# Patient Record
Sex: Female | Born: 1985 | Race: White | Hispanic: No | Marital: Single | Smoking: Never smoker
Health system: Southern US, Community
[De-identification: ages and names within clinical notes are randomized; demographics above are authoritative.]

## PROBLEM LIST (undated history)

## (undated) DIAGNOSIS — J45909 Unspecified asthma, uncomplicated: Secondary | ICD-10-CM

## (undated) DIAGNOSIS — T7840XA Allergy, unspecified, initial encounter: Secondary | ICD-10-CM

## (undated) DIAGNOSIS — F419 Anxiety disorder, unspecified: Secondary | ICD-10-CM

## (undated) DIAGNOSIS — D649 Anemia, unspecified: Secondary | ICD-10-CM

## (undated) HISTORY — DX: Allergy, unspecified, initial encounter: T78.40XA

## (undated) HISTORY — DX: Anemia, unspecified: D64.9

## (undated) HISTORY — DX: Unspecified asthma, uncomplicated: J45.909

## (undated) HISTORY — PX: WISDOM TOOTH EXTRACTION: SHX21

## (undated) HISTORY — DX: Anxiety disorder, unspecified: F41.9

---

## 2010-04-01 ENCOUNTER — Ambulatory Visit: Payer: Self-pay | Admitting: Family Medicine

## 2010-04-01 DIAGNOSIS — M25579 Pain in unspecified ankle and joints of unspecified foot: Secondary | ICD-10-CM | POA: Insufficient documentation

## 2010-04-15 ENCOUNTER — Ambulatory Visit: Payer: Self-pay | Admitting: Family Medicine

## 2010-04-18 ENCOUNTER — Encounter: Admission: RE | Admit: 2010-04-18 | Discharge: 2010-04-18 | Payer: Self-pay | Admitting: Family Medicine

## 2010-04-18 ENCOUNTER — Encounter: Payer: Self-pay | Admitting: Family Medicine

## 2010-04-19 ENCOUNTER — Encounter: Payer: Self-pay | Admitting: Family Medicine

## 2010-11-19 NOTE — Letter (Signed)
Summary: [facility]   Imported By: Marily Memos 04/30/2010 11:56:30  _____________________________________________________________________  External Attachment:    Type:   Image     Comment:   External Document

## 2010-11-19 NOTE — Letter (Signed)
Summary: Imaging Authorization [facility]  Imaging Authorization [employer]   Imported By: Judge Stall 04/19/2010 09:56:35  _____________________________________________________________________  External Attachment:    Type:   Image     Comment:   External Document

## 2010-11-19 NOTE — Letter (Signed)
Summary: [employer], MRI request form  [employer], MRI request form   Imported By: Marily Memos 04/16/2010 10:31:48  _____________________________________________________________________  External Attachment:    Type:   Image     Comment:   External Document

## 2010-11-19 NOTE — Assessment & Plan Note (Signed)
Summary: F/U,MC   Vital Signs:  Patient profile:   25 year old female BP sitting:   115 / 57  Vitals Entered By: Lillia Pauls CMA (April 15, 2010 4:08 PM)  History of Present Illness: f/u ankle  pain no improvement--pain still 3/10 when she does aggravating motions likely plantr flexion and eversion /inversion no new symptoms has  done her exercises regularly.  Review of Systems       Please see HPI for additional ROS.   Physical Exam  Msk:  Left ankle TTP along extensor hallucis lingius. questionable tenderness over navicular nbone  normal sensation to soft touch gaut is normal   Impression & Recommendations:  Problem # 1:  ANKLE PAIN, LEFT (ICD-719.47)  Orders: MRI without Contrast (MRI w/o Contrast) not improved will image  Complete Medication List: 1)  Voltaren 1 % Gel (Diclofenac sodium) .... Sig as directed to ankle one tube (100 g)  Patient Instructions: 1)  MRI OF L ANKLE SCHD FOR WED JUNE 29 AT 7:45 PM AT THE [facility].

## 2010-11-19 NOTE — Assessment & Plan Note (Signed)
Summary: NP,ANKLE INJURY,MC   Vital Signs:  Patient profile:   25 year old female Height:      69 inches Weight:      123 pounds BMI:     18.23 BP sitting:   109 / 72  Vitals Entered By: Lillia Pauls CMA (April 01, 2010 4:28 PM)  History of Present Illness: DATE of INJURY:  February 13, 2010  Playing soccer, stuck left foot out to stop ball coming across from her medial side. Immediate 3/10 pain. Not a lot of swelling. Has essentially not gotten better. pain with plantar flexion and with soccer---has been able to play tennis. Ankle feels stiff. has tried brace and NSAIDS-no help, PERTINENT PMH/PSH:  No prior injury other than mild sprain to this ankle. no surgery to ankle-  Review of Systems  The patient denies anorexia, fever, weight loss, and weight gain.         see hpi  Physical Exam  General:  alert, well-developed, well-nourished, and well-hydrated.   Additional Exam:  [place] limited--outline navicular bone shows no cortical defect. Some edema seen inteh tendon sheath for ant tib   Foot/Ankle Exam  Gait:    Normal heel-toe gait pattern bilaterally.    Skin:    Intact with no scars, lesions, rashes, or bruising.    Palpation:    TTP left ankle over tibialis anterior tendon sheath. Some mild tenderness surrounding this on navicular bone but no discrete area of tenderness  Ankle Exam:    Right:    Inspection/Palpation:  Full strength dorsi and plantar flexion. Pain with reisted  plantar flexion in area ant tib tnedon.  Ankle stable to anterior drawer, eversion and inversion   Impression & Recommendations:  Problem # 1:  ANKLE PAIN, LEFT (ICD-719.47)  Orders: Theraband per yard (A9300) anterio tibialis tendonitis stretches, ice heat rtc 3 weeks. There could be an underlying issue with navicular but I suspect this is isoltaed tendonitis. if not resolving with tx, wil consider further imaging navicular  Complete Medication List: 1)  Voltaren 1 % Gel (Diclofenac  sodium) .... Sig as directed to ankle one tube (100 g)  Patient Instructions: 1)  Tendonitis in anterior foot/ankle; 2)  Use heat for 10 minutes followed by ice 15 min twice a day 3)  Use the voltaren gel 4 times a day to that area 4)  Do the exercises three times a day 5)  See me in 2 weeks or so. Prescriptions: VOLTAREN 1 % GEL (DICLOFENAC SODIUM) sig as directed to ankle one tube (100 g)  #1 x 1   Entered and Authorized by:   Denny Levy MD   Signed by:   Denny Levy MD on 04/01/2010   Method used:   Electronically to        [facility]. [phone]* (retail)       [address]       [place], [place]  [address]       Ph: [phone]       Fax: [phone]   RxID:   [id number]

## 2010-12-27 ENCOUNTER — Encounter: Payer: Self-pay | Admitting: *Deleted

## 2011-12-15 ENCOUNTER — Other Ambulatory Visit: Payer: Self-pay | Admitting: Internal Medicine

## 2011-12-15 DIAGNOSIS — F419 Anxiety disorder, unspecified: Secondary | ICD-10-CM

## 2011-12-15 DIAGNOSIS — F429 Obsessive-compulsive disorder, unspecified: Secondary | ICD-10-CM

## 2011-12-15 MED ORDER — CLOMIPRAMINE HCL 50 MG PO CAPS: 50.0000 mg | ORAL_CAPSULE | Freq: Every day | ORAL | Status: DC

## 2011-12-15 NOTE — Assessment & Plan Note (Signed)
decr to 40 proz and add anaf 40 at hs  Cont prn xanax F/u next week here

## 2012-02-06 ENCOUNTER — Other Ambulatory Visit: Payer: Self-pay | Admitting: Internal Medicine

## 2012-02-06 DIAGNOSIS — F429 Obsessive-compulsive disorder, unspecified: Secondary | ICD-10-CM

## 2012-02-06 MED ORDER — ESCITALOPRAM OXALATE 20 MG PO TABS: 20.0000 mg | ORAL_TABLET | Freq: Every day | ORAL | Status: DC

## 2012-02-06 NOTE — Progress Notes (Signed)
prozac 40.Marland Kitchen.fair control but not enough to keep from affecting academics and anxiety level. At 60mg =terrible dreams/sweating Doing well with therapy Will change to lexapro 20mg  and advance slowly

## 2012-04-17 ENCOUNTER — Other Ambulatory Visit: Payer: Self-pay | Admitting: Internal Medicine

## 2012-04-17 MED ORDER — FLUOXETINE HCL 20 MG PO TABS: 60.0000 mg | ORAL_TABLET | Freq: Every day | ORAL | Status: DC

## 2012-04-17 NOTE — Progress Notes (Signed)
Lexapro 20 mg hasn't been as effective as Prozac Dreams continued to be a big part of the problem along with her daytime obsessions mostly involving her recent relationship Will change back to Prozac at a dose of 60 mg She has an appointment with psychiatry in [place] for August  referred her to [facility] for therapy She will followup by phone or e-mail or office visit

## 2012-06-11 ENCOUNTER — Other Ambulatory Visit: Payer: Self-pay | Admitting: Internal Medicine

## 2012-06-11 NOTE — Telephone Encounter (Signed)
Patient's chart is at the nurses station in the pa pool pile. °

## 2012-06-11 NOTE — Telephone Encounter (Signed)
LMOM for patient to call back.

## 2012-06-11 NOTE — Telephone Encounter (Signed)
Please call this patient.  Last seen here 09/13/2011. I don't see that we've ever done her GYN exam, and that she has a GYN.  Please get the details.  I am happy to authorize one month, but she needs to be seen either here or at GYN for additional refills.

## 2012-06-14 ENCOUNTER — Telehealth: Payer: Self-pay

## 2012-06-14 NOTE — Telephone Encounter (Signed)
Chart pulled DOS 09/13/11 at the nurses station in the PA pool pile.

## 2012-06-14 NOTE — Telephone Encounter (Signed)
PT STATES SOMEONE HAD CALLED HER REGARDING HER MEDS. PLEASE CALL [phone]

## 2012-06-14 NOTE — Telephone Encounter (Signed)
Pt stated that her pharmacy had mistakenly sent the request for her Nuva-ring RF here instead of to her GYN but she has already gotten in touch w/her GYN and they have done the RF. No action needed on our part.

## 2012-06-25 ENCOUNTER — Other Ambulatory Visit: Payer: Self-pay | Admitting: Family Medicine

## 2012-06-25 MED ORDER — ALPRAZOLAM 0.25 MG PO TABS: 0.2500 mg | ORAL_TABLET | Freq: Three times a day (TID) | ORAL | Status: AC | PRN

## 2012-06-25 MED ORDER — FLUOXETINE HCL 20 MG PO TABS: 20.0000 mg | ORAL_TABLET | Freq: Every day | ORAL | Status: DC

## 2012-08-03 ENCOUNTER — Other Ambulatory Visit: Payer: Self-pay | Admitting: Internal Medicine

## 2012-08-03 DIAGNOSIS — F429 Obsessive-compulsive disorder, unspecified: Secondary | ICD-10-CM

## 2012-08-03 MED ORDER — FLUOXETINE HCL 20 MG PO TABS: 60.0000 mg | ORAL_TABLET | Freq: Every day | ORAL | Status: DC

## 2012-08-03 NOTE — Progress Notes (Signed)
In June I referred her to [facility] for Psychiatry consult-she is doing well Biweekly visits with him doing therapy. He did not change her meds and it is most convenient for her if I keep prescribing Meds ordered this encounter  Medications  . FLUoxetine (PROZAC) 20 MG tablet    Sig: Take 3 tablets (60 mg total) by mouth daily.    Dispense:  270 tablet    Refill:  3

## 2013-01-23 ENCOUNTER — Ambulatory Visit (INDEPENDENT_AMBULATORY_CARE_PROVIDER_SITE_OTHER): Payer: BC Managed Care – PPO | Admitting: Internal Medicine

## 2013-01-23 VITALS — BP 98/62 | HR 64 | Temp 98.1°F | Resp 16 | Ht 69.2 in | Wt 116.8 lb

## 2013-01-23 DIAGNOSIS — R5383 Other fatigue: Secondary | ICD-10-CM

## 2013-01-23 DIAGNOSIS — Z0000 Encounter for general adult medical examination without abnormal findings: Secondary | ICD-10-CM

## 2013-01-23 DIAGNOSIS — D509 Iron deficiency anemia, unspecified: Secondary | ICD-10-CM

## 2013-01-23 DIAGNOSIS — R5381 Other malaise: Secondary | ICD-10-CM

## 2013-01-23 LAB — COMPREHENSIVE METABOLIC PANEL
Albumin: 4.3 g/dL (ref 3.5–5.2)
BUN: 10 mg/dL (ref 6–23)
Calcium: 9.4 mg/dL (ref 8.4–10.5)
Potassium: 4.1 mEq/L (ref 3.5–5.3)
Sodium: 140 mEq/L (ref 135–145)

## 2013-01-23 LAB — POCT CBC
Granulocyte percent: 52.1 %G (ref 37–80)
HCT, POC: 42.8 % (ref 37.7–47.9)
Hemoglobin: 13.6 g/dL (ref 12.2–16.2)
Lymph, poc: 1.9 (ref 0.6–3.4)
MCV: 90.2 fL (ref 80–97)
POC Granulocyte: 2.4 (ref 2–6.9)
POC MID %: 7.1 %M (ref 0–12)
Platelet Count, POC: 295 10*3/uL (ref 142–424)
RBC: 4.75 M/uL (ref 4.04–5.48)
RDW, POC: 15 %

## 2013-01-23 LAB — T4, FREE: Free T4: 1.25 ng/dL (ref 0.80–1.80)

## 2013-01-23 LAB — IRON AND TIBC
%SAT: 19 % — ABNORMAL LOW (ref 20–55)
Iron: 85 ug/dL (ref 42–145)
TIBC: 451 ug/dL (ref 250–470)

## 2013-01-23 LAB — TSH: TSH: 1.783 u[IU]/mL (ref 0.350–4.500)

## 2013-01-23 NOTE — Progress Notes (Signed)
Subjective:    Patient ID: Kristen Lane, female    DOB: 12-Dec-1985, 27 y.o.   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  HENT: Positive for dental problem. Negative for hearing loss, rhinorrhea and neck pain.        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  Respiratory: Negative for cough, shortness of breath and wheezing.        Hx EIA but none recent  Cardiovascular: Negative for chest pain, palpitations and leg swelling.  Gastrointestinal: Negative for abdominal pain, diarrhea and constipation.  Genitourinary: Negative for dysuria, vaginal discharge, difficulty urinating, menstrual problem and pelvic pain.       Has gyn/nuva ring  Allergic/Immunologic: Negative for immunocompromised state.  Neurological: Negative for dizziness, weakness and headaches.  Hematological: Negative for adenopathy. Does not bruise/bleed easily.       Objective:   Physical Exam BP 98/62  Pulse 64  Temp(Src) 98.1 F (36.7 C) (Oral)  Resp 16  Ht 5' 9.2" (1.758 m)  Wt 116 lb 12.8 oz (52.98 kg)  BMI 17.14 kg/m2  SpO2 99%  LMP 01/18/2013 NAD/happy RERRLA/EOM conj ENT clear x tender over TMJs  No Tmeg/nodule Heart regular without murmur click Lungs clear Abdomen supple without organomegaly or masses Extremities  clear Neurological intact Psychiatric stable       Results for orders placed in visit on 01/23/13  POCT CBC      Result Value Range   WBC 4.6  4.6 - 10.2 K/uL   Lymph, poc 1.9  0.6 - 3.4   POC LYMPH PERCENT 40.8  10 - 50 %L   MID (cbc) 0.3  0 - 0.9   POC MID % 7.1  0 - 12 %M   POC Granulocyte 2.4  2 - 6.9   Granulocyte percent 52.1  37 - 80 %G   RBC 4.75  4.04 - 5.48 M/uL   Hemoglobin 13.6  12.2 - 16.2 g/dL   HCT, POC 40.9  81.1 - 47.9 %   MCV 90.2  80 - 97 fL   MCH, POC 28.6  27 - 31.2 pg   MCHC 31.8  31.8 - 35.4 g/dL   RDW, POC 91.4     Platelet Count, POC 295  142 - 424 K/uL   MPV 7.8  0 - 99.8 fL   EKG WNL incl QTc  Assessment & Plan:  CPE Hx Fe def anemia TMJ-clenching Fatigue intermittent OCD 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 FLUoxetine (PROZAC) 40 MG capsule    Sig: Take 80 mg by mouth daily.  . folic acid (FOLVITE) 1 MG tablet    Sig: Take 1 mg by mouth daily.  . Multiple Vitamins-Calcium (ONE-A-DAY WOMENS FORMULA PO)    Sig:  Take 1 tablet by mouth daily.  Marland Kitchen ALPRAZolam (XANAX) 0.25 MG tablet    Sig: Take 0.25 mg by mouth as needed for sleep.  . clonazePAM (KLONOPIN) 0.5 MG tablet    Sig: Take 0.5 mg by mouth as needed for anxiety.   Results for orders placed in visit on 01/23/13  COMPREHENSIVE METABOLIC PANEL      Result Value Range   Sodium 140  135 - 145 mEq/L   Potassium 4.1  3.5 - 5.3 mEq/L   Chloride 104  96 - 112 mEq/L   CO2 29  19 - 32 mEq/L   Glucose, Bld 87  70 - 99 mg/dL   BUN 10  6 - 23 mg/dL   Creat 1.91  4.78 - 2.95 mg/dL   Total Bilirubin 0.5  0.3 - 1.2 mg/dL   Alkaline Phosphatase 43  39 - 117 U/L   AST 20  0 - 37 U/L   ALT 14  0 - 35 U/L   Total Protein 7.3  6.0 - 8.3 g/dL   Albumin 4.3  3.5 - 5.2 g/dL   Calcium 9.4  8.4 - 62.1 mg/dL  TSH      Result Value Range   TSH 1.783  0.350 - 4.500 uIU/mL  T4, FREE      Result Value Range   Free T4 1.25  0.80 - 1.80 ng/dL  IRON AND TIBC      Result Value Range    Iron 85  42 - 145 ug/dL   UIBC 308  657 - 846 ug/dL   TIBC 962  952 - 841 ug/dL   %SAT 19 (*) 20 - 55 %  POCT CBC      Result Value Range   WBC 4.6  4.6 - 10.2 K/uL   Lymph, poc 1.9  0.6 - 3.4   POC LYMPH PERCENT 40.8  10 - 50 %L   MID (cbc) 0.3  0 - 0.9   POC MID % 7.1  0 - 12 %M   POC Granulocyte 2.4  2 - 6.9   Granulocyte percent 52.1  37 - 80 %G   RBC 4.75  4.04 - 5.48 M/uL   Hemoglobin 13.6  12.2 - 16.2 g/dL   HCT, POC 32.4  40.1 - 47.9 %   MCV 90.2  80 - 97 fL   MCH, POC 28.6  27 - 31.2 pg   MCHC 31.8  31.8 - 35.4 g/dL   RDW, POC 02.7     Platelet Count, POC 295  142 - 424 K/uL   MPV 7.8  0 - 99.8 fL

## 2013-01-24 ENCOUNTER — Encounter: Payer: Self-pay | Admitting: Internal Medicine

## 2013-07-17 ENCOUNTER — Ambulatory Visit (INDEPENDENT_AMBULATORY_CARE_PROVIDER_SITE_OTHER): Payer: BC Managed Care – PPO | Admitting: Internal Medicine

## 2013-07-17 VITALS — BP 102/62 | HR 60 | Temp 98.0°F | Resp 16 | Ht 68.5 in | Wt 123.2 lb

## 2013-07-17 DIAGNOSIS — R5381 Other malaise: Secondary | ICD-10-CM

## 2013-07-17 LAB — POCT CBC
Lymph, poc: 2.2 (ref 0.6–3.4)
MCHC: 32.1 g/dL (ref 31.8–35.4)
MCV: 93.5 fL (ref 80–97)
Platelet Count, POC: 245 10*3/uL (ref 142–424)

## 2013-07-17 NOTE — Progress Notes (Signed)
  Subjective:    Patient ID: Kristen Lane, female    DOB: [birth year], 27 y.o.   MRN: [id number]  HPI complaining of 4 weeks of fatigue with intermittent mild sore throat Had sore throat at the onset for a few days but no fever No cough or runny nose No other signs of infection No problems with sleep sHe is working hard in new job but this feeling persisted even during vacation last week Appetite is good  No new medications, in fact is decreasing dose of Prozac and now at 40 mg every other day under the guidance of her psychiatrist in [facility] Review of Systems Noncontributory with regard of present illness    Objective:   Physical Exam BP 102/62  Pulse 60  Temp(Src) 98 F (36.7 C) (Oral)  Resp 16  Ht 5' 8.5" (1.74 m)  Wt 123 lb 3.2 oz (55.883 kg)  BMI 18.46 kg/m2  SpO2 99%  LMP 07/13/2013 HEENT clear with no nodes No thyromegaly Heart regular without murmur Lungs clear Abdomen supple without organomegaly Skin without rash Joints clear Neurological intact Mood good/affect appropriate   Results for orders placed in visit on 07/17/13  POCT CBC      Result Value Range   WBC 4.6  4.6 - 10.2 K/uL   Lymph, poc 2.2  0.6 - 3.4   POC LYMPH PERCENT 48.5  10 - 50 %L   MID (cbc) 0.3  0 - 0.9   POC MID % 7.4  0 - 12 %M   POC Granulocyte 2.0  2 - 6.9   Granulocyte percent 44.1  37 - 80 %G   RBC 4.50  4.04 - 5.48 M/uL   Hemoglobin 13.5  12.2 - 16.2 g/dL   HCT, POC 40.9  81.1 - 47.9 %   MCV 93.5  80 - 97 fL   MCH, POC 30.0  27 - 31.2 pg   MCHC 32.1  31.8 - 35.4 g/dL   RDW, POC 91.4     Platelet Count, POC 245  142 - 424 K/uL   MPV 7.9  0 - 99.8 fL       Assessment & Plan:  Fatigue Lymphocytosis-ck ebv  eat sleep exercise and avoid stress Recheck if not well 1 month

## 2013-07-19 LAB — EPSTEIN-BARR VIRUS VCA ANTIBODY PANEL
EBV EA IgG: 24.1 U/mL — ABNORMAL HIGH (ref <9.0)
EBV NA IgG: <3 U/mL (ref <18.0)
EBV VCA IgG: 571 U/mL — ABNORMAL HIGH (ref <18.0)
EBV VCA IgM: <10 U/mL (ref <36.0)

## 2013-07-22 ENCOUNTER — Encounter: Payer: Self-pay | Admitting: Internal Medicine

## 2014-04-16 ENCOUNTER — Ambulatory Visit (INDEPENDENT_AMBULATORY_CARE_PROVIDER_SITE_OTHER): Payer: No Typology Code available for payment source | Admitting: Internal Medicine

## 2014-04-16 VITALS — BP 102/60 | HR 72 | Temp 98.2°F | Resp 18 | Wt 123.0 lb

## 2014-04-16 DIAGNOSIS — K120 Recurrent oral aphthae: Secondary | ICD-10-CM

## 2014-04-16 DIAGNOSIS — F429 Obsessive-compulsive disorder, unspecified: Secondary | ICD-10-CM

## 2014-04-16 MED ORDER — FLUOCINONIDE 0.05 % EX GEL: 1.0000 "application " | Freq: Two times a day (BID) | CUTANEOUS | Status: DC

## 2014-04-16 MED ORDER — AMOXICILLIN 875 MG PO TABS: 875.0000 mg | ORAL_TABLET | Freq: Two times a day (BID) | ORAL | Status: DC

## 2014-04-16 MED ORDER — LIDOCAINE VISCOUS 2 % MT SOLN: OROMUCOSAL | Status: DC

## 2014-04-16 NOTE — Progress Notes (Signed)
   Subjective:    Patient ID: Kristen Lane, female    DOB: [birth year], 28 y.o.   MRN: [id number]  HPI She has recurrent aphthous ulcers After no ulcer in several months she now has a right lower ulcer adjacent to her lower molar but has had increasing tenderness over the last 2-3 days with swelling noted on the external jawline/she worries about the development of an abscess which has happened once before  MSW counselor working with Dr Pablo Ledger Past medical problems doing well-recently started Effexor which is having good effect  Review of Systems No fever    Objective:   Physical Exam BP 102/60  Pulse 72  Temp(Src) 98.2 F (36.8 C)  Resp 18  Wt 123 lb (55.792 kg)  SpO2 100%  LMP 04/10/2014 There is an 0.7 cm tender ulcer adjacent to the right second lower molar/no root abscess She is tender externally with swelling along the mandible but no erythema No regional adenopathy      Assessment & Plan:  Oral aphthous ulcer  OCD (obsessive compulsive disorder)  Meds ordered this encounter  Medications  . lidocaine (XYLOCAINE) 2 % solution    Sig: Apply to lesion every 2 h as needed for pain    Dispense:  100 mL    Refill:  1  . amoxicillin (AMOXIL) 875 MG tablet    Sig: Take 1 tablet (875 mg total) by mouth 2 (two) times daily.    Dispense:  20 tablet    Refill:  0  . fluocinonide gel (LIDEX) 0.05 %    Sig: Apply 1 application topically 2 (two) times daily.    Dispense:  30 g    Refill:  0

## 2014-08-10 ENCOUNTER — Other Ambulatory Visit: Payer: Self-pay | Admitting: Internal Medicine

## 2014-08-10 DIAGNOSIS — F429 Obsessive-compulsive disorder, unspecified: Secondary | ICD-10-CM

## 2014-08-10 DIAGNOSIS — J45990 Exercise induced bronchospasm: Secondary | ICD-10-CM

## 2014-08-10 MED ORDER — ALBUTEROL SULFATE HFA 108 (90 BASE) MCG/ACT IN AERS: 2.0000 | INHALATION_SPRAY | Freq: Four times a day (QID) | RESPIRATORY_TRACT | Status: DC | PRN

## 2014-08-10 MED ORDER — VENLAFAXINE HCL ER 75 MG PO CP24: 225.0000 mg | ORAL_CAPSULE | Freq: Every day | ORAL | Status: DC

## 2014-08-10 NOTE — Progress Notes (Signed)
OCD (obsessive compulsive disorder)  Dr Fabienne Bruns and Kristen Lane well on effexor 225 xr tho trouble w/ insurance coverage for brand name(800$) Needs klon rarely Also needs inhaler for EIA  Meds ordered this encounter  Medications  . venlafaxine XR (EFFEXOR-XR) 75 MG 24 hr capsule    Sig: Take 3 capsules (225 mg total) by mouth daily with breakfast.    Dispense:  90 capsule    Refill:  5  . albuterol (PROVENTIL HFA;VENTOLIN HFA) 108 (90 BASE) MCG/ACT inhaler    Sig: Inhale 2 puffs into the lungs every 6 (six) hours as needed for wheezing or shortness of breath.    Dispense:  1 Inhaler    Refill:  3

## 2014-12-24 ENCOUNTER — Ambulatory Visit (INDEPENDENT_AMBULATORY_CARE_PROVIDER_SITE_OTHER): Payer: No Typology Code available for payment source | Admitting: Family Medicine

## 2014-12-24 VITALS — BP 110/70 | HR 76 | Temp 98.4°F | Ht 69.0 in | Wt 120.1 lb

## 2014-12-24 DIAGNOSIS — L209 Atopic dermatitis, unspecified: Secondary | ICD-10-CM | POA: Diagnosis not present

## 2014-12-24 MED ORDER — TRIAMCINOLONE 0.1 % CREAM:EUCERIN CREAM 1:1: 1.0000 "application " | TOPICAL_CREAM | Freq: Three times a day (TID) | CUTANEOUS | Status: AC

## 2014-12-24 MED ORDER — PREDNISONE 20 MG PO TABS: 20.0000 mg | ORAL_TABLET | Freq: Two times a day (BID) | ORAL | Status: DC

## 2014-12-24 NOTE — Patient Instructions (Signed)
Eczema Eczema, also called atopic dermatitis, is a skin disorder that causes inflammation of the skin. It causes a red rash and dry, scaly skin. The skin becomes very itchy. Eczema is generally worse during the cooler winter months and often improves with the warmth of summer. Eczema usually starts showing signs in infancy. Some children outgrow eczema, but it may last through adulthood.  CAUSES  The exact cause of eczema is not known, but it appears to run in families. People with eczema often have a family history of eczema, allergies, asthma, or hay fever. Eczema is not contagious. Flare-ups of the condition may be caused by:   Contact with something you are sensitive or allergic to.   Stress. SIGNS AND SYMPTOMS  Dry, scaly skin.   Red, itchy rash.   Itchiness. This may occur before the skin rash and may be very intense.  DIAGNOSIS  The diagnosis of eczema is usually made based on symptoms and medical history. TREATMENT  Eczema cannot be cured, but symptoms usually can be controlled with treatment and other strategies. A treatment plan might include:  Controlling the itching and scratching.   Use over-the-counter antihistamines as directed for itching. This is especially useful at night when the itching tends to be worse.   Use over-the-counter steroid creams as directed for itching.   Avoid scratching. Scratching makes the rash and itching worse. It may also result in a skin infection (impetigo) due to a break in the skin caused by scratching.   Keeping the skin well moisturized with creams every day. This will seal in moisture and help prevent dryness. Lotions that contain alcohol and water should be avoided because they can dry the skin.   Limiting exposure to things that you are sensitive or allergic to (allergens).   Recognizing situations that cause stress.   Developing a plan to manage stress.  HOME CARE INSTRUCTIONS   Only take over-the-counter or  prescription medicines as directed by your health care provider.   Do not use anything on the skin without checking with your health care provider.   Keep baths or showers short (5 minutes) in warm (not hot) water. Use mild cleansers for bathing. These should be unscented. You may add nonperfumed bath oil to the bath water. It is best to avoid soap and bubble bath.   Immediately after a bath or shower, when the skin is still damp, apply a moisturizing ointment to the entire body. This ointment should be a petroleum ointment. This will seal in moisture and help prevent dryness. The thicker the ointment, the better. These should be unscented.   Keep fingernails cut short. Children with eczema may need to wear soft gloves or mittens at night after applying an ointment.   Dress in clothes made of cotton or cotton blends. Dress lightly, because heat increases itching.   A child with eczema should stay away from anyone with fever blisters or cold sores. The virus that causes fever blisters (herpes simplex) can cause a serious skin infection in children with eczema. SEEK MEDICAL CARE IF:   Your itching interferes with sleep.   Your rash gets worse or is not better within 1 week after starting treatment.   You see pus or soft yellow scabs in the rash area.   You have a fever.   You have a rash flare-up after contact with someone who has fever blisters.  Document Released: 10/03/2000 Document Revised: 07/27/2013 Document Reviewed: 05/09/2013 [employer] Patient Information 2015 [employer]. This information   is not intended to replace advice given to you by your health care provider. Make sure you discuss any questions you have with your health care provider.  

## 2014-12-24 NOTE — Progress Notes (Signed)
    MRN: [id number] DOB: Mar 14, 1986  Subjective:   Kristen Lane is a 29 y.o. female presenting for chief complaint of Rash  Reports 1 week history of rash over her right hand. Rash is severely itchy. Has tried taking benadryl and a steroid cream she had from a previous skin rash (07/2014) with minimal to no relief. She was treated for scabies 07/2014, provided with steroid cream for itching. Admits that this rash is different from her scabies rash. Denies fevers, drainage of pus or bleeding, spread of rash, burning, stinging, numbness, tingling. Denies any other aggravating or relieving factors, no other questions or concerns.  Kristen Lane has a current medication list which includes the following prescription(s): albuterol, clonazepam, multiple vitamins-calcium, nuvaring, venlafaxine xr, prednisone, and triamcinolone 0.1 % cream : eucerin. She has No Known Allergies.  Kristen Lane  has a past medical history of Allergy; Anemia; Anxiety; and Asthma. Also  has no past surgical history on file.  ROS As in subjective.  Objective:   Vitals: BP 110/70 mmHg  Pulse 76  Temp(Src) 98.4 F (36.9 C) (Oral)  Ht 5\' 9"  (1.753 m)  Wt 120 lb 2 oz (54.488 kg)  BMI 17.73 kg/m2  SpO2 99%  Physical Exam  Constitutional: She is oriented to person, place, and time and well-developed, well-nourished, and in no distress.  Cardiovascular: Normal rate.   Pulmonary/Chest: Effort normal.  Neurological: She is alert and oriented to person, place, and time.  Skin: Skin is warm and dry. Rash (dry scaly, maculopapular rash over dorsal aspect of right hand, mainly over metacarpal region up to wrist) noted. No erythema. No pallor.   Assessment and Plan :   1. Atopic dermatitis - Start oral steroid x3 doses of 40mg , use with triamcinolone-eucerin cream topically - Return to clinic if symptoms do not improve in 1 week  Jaynee Eagles, PA-C [facility] [phone] 12/24/2014  3:35 PM

## 2015-01-08 ENCOUNTER — Encounter: Payer: Self-pay | Admitting: Internal Medicine

## 2015-01-08 NOTE — Progress Notes (Unsigned)
   Subjective:    Patient ID: Kristen Lane, female    DOB: 03-03-1986, 29 y.o.   MRN: [id number]  HPI Needs follow-up appointment to discuss current medications   Review of Systems     Objective:   Physical Exam        Assessment & Plan:

## 2015-01-10 ENCOUNTER — Ambulatory Visit (INDEPENDENT_AMBULATORY_CARE_PROVIDER_SITE_OTHER): Payer: No Typology Code available for payment source | Admitting: Internal Medicine

## 2015-01-10 VITALS — BP 100/64 | HR 73 | Temp 97.6°F | Resp 18 | Ht 68.2 in | Wt 118.0 lb

## 2015-01-10 DIAGNOSIS — F429 Obsessive-compulsive disorder, unspecified: Secondary | ICD-10-CM

## 2015-01-10 DIAGNOSIS — F42 Obsessive-compulsive disorder: Secondary | ICD-10-CM | POA: Diagnosis not present

## 2015-01-12 NOTE — Progress Notes (Signed)
Follow-up for obsessive compulsive disorder  Has done extremely well over the last 2 years. 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 Has had some medication side effects and problems with insurance coverage over medications. She originally responded(Dr Kristen Lane) to Effexor brand name extended release(AFTER PROZAC) and then had to be switched to regular Effexor which caused her to have loss of control of obsessions as well as having palpitations. Her level of anxiety was increased as well. This happened to some degree when she was switched to the generic brand of the extended release even when increased to 225 mg she only increase the side effects and didn't increase the control of obsessive thinking. Over the last 2 months at 225 mg of extended release generic she has experienced frequent pounding heart rates that are random and without chest pain or shortness of breath. She may have the specifically after eating at other times as well. The pounding will last for about 30 minutes. A cup need by epigastric pain at times. No reflux, no nausea, no diarrhea. She has reduce his dose to 150 mg over the last 2 weeks and those symptoms have resolved but she is having more trouble controlling her obsessions. If possible she wants to resume the brand name treatment.  Now on a steady relationship that is very good. She still has nightmares and occasional daytime obsessive thinking about her former relationship. Refer to past chart notes for more explanation. These dreams are vivid and will awaken her. Fortunately her daytime symptoms are not enough to interfere with work or play and her not inhibiting joy. She has a history of vivid dreams and childhood both good and bad but no overt anxiety disorder. Her nightmares never fit with sleep care [place] or with nocturnal seizures with regard to the symptoms. Although, she does have vocalizations commonly. She doesn't wake with a  disorientation. She does not have restless legs or nocturnal movements. There is no amnesia in the morning. She may have multiple dreams at night. While sometimes these drains make her feel nonrestorative she has plenty of energy for athletics. She is always been someone who is quick to take naps and seemed to need more sleep than everyone else but she does not have sleepiness while driving wheezing are going to movies.  She has Klonopin to use for panic attacks but has not needed it except to sleep on occasion over the past 2 weeks.  From a health standpoint she has no symptoms. Weight has remained stable pop. She is very active with various sports/league play.  She is now in counseling therapy with Kristen Lane and feels this is been very beneficial as they are looking back at childhood problems that outline enmeshment-especially with Mom but pervasive in relationships in general. Worsening after puberty but without definite self-esteem issues. She had issues particularly with going away to college and leaving mom behind. Working on "understanding anatomy of personality schemas."  Impression OCD (obsessive compulsive disorder)  medication side effects  Overall she is much improved over 2-3 years ago. We will try to get preauthorization to resume the name brand Effexor XR 150 mg She will continue therapy She may call for medicine refills

## 2015-01-15 ENCOUNTER — Telehealth: Payer: Self-pay

## 2015-01-15 NOTE — Telephone Encounter (Signed)
PA completed for Brand name Effexor on covermymeds. Pending.

## 2015-01-18 MED ORDER — EFFEXOR XR 150 MG PO CP24: 150.0000 mg | ORAL_CAPSULE | Freq: Every day | ORAL | Status: DC

## 2015-01-18 NOTE — Telephone Encounter (Signed)
Pt called back and asked for the name brand Effexor Rx to be sent in. Dr Doolittle's notes state that he wants her taking the 150mg  ER if it is approved. I will send in the Rx, and pt will notify us if it is not effective.

## 2015-01-18 NOTE — Telephone Encounter (Signed)
Dr Laney Pastor, Juluis Rainier.

## 2015-01-18 NOTE — Telephone Encounter (Signed)
PA approved through 01/16/18. Notified pt on VM.

## 2015-01-18 NOTE — Addendum Note (Signed)
Addended by: Elwyn Reach A on: 01/18/2015 05:00 PM   Modules accepted: Orders

## 2015-01-19 ENCOUNTER — Telehealth: Payer: Self-pay

## 2015-01-19 NOTE — Telephone Encounter (Signed)
Patient is unable to get her medication because the pharmacy doesn't see that the insurance approved it. Please call! [phone]

## 2015-01-22 NOTE — Telephone Encounter (Signed)
Approved.  

## 2015-01-22 NOTE — Telephone Encounter (Signed)
I have the PA with me and will complete today.

## 2015-02-14 ENCOUNTER — Ambulatory Visit (INDEPENDENT_AMBULATORY_CARE_PROVIDER_SITE_OTHER): Payer: No Typology Code available for payment source | Admitting: Internal Medicine

## 2015-02-14 ENCOUNTER — Encounter: Payer: Self-pay | Admitting: Internal Medicine

## 2015-02-14 VITALS — BP 96/60 | HR 73 | Temp 98.1°F | Resp 16 | Ht 69.0 in | Wt 114.8 lb

## 2015-02-14 DIAGNOSIS — Z23 Encounter for immunization: Secondary | ICD-10-CM | POA: Diagnosis not present

## 2015-02-14 DIAGNOSIS — R1013 Epigastric pain: Secondary | ICD-10-CM

## 2015-02-14 LAB — COMPREHENSIVE METABOLIC PANEL
ALT: 17 U/L (ref 0–35)
AST: 19 U/L (ref 0–37)
Albumin: 4.2 g/dL (ref 3.5–5.2)
Alkaline Phosphatase: 42 U/L (ref 39–117)
BILIRUBIN TOTAL: 0.5 mg/dL (ref 0.2–1.2)
BUN: 8 mg/dL (ref 6–23)
CHLORIDE: 101 meq/L (ref 96–112)
CO2: 24 mEq/L (ref 19–32)
CREATININE: 0.85 mg/dL (ref 0.50–1.10)
Calcium: 9.4 mg/dL (ref 8.4–10.5)
Glucose, Bld: 87 mg/dL (ref 70–99)
POTASSIUM: 5.2 meq/L (ref 3.5–5.3)
Sodium: 140 mEq/L (ref 135–145)
Total Protein: 6.6 g/dL (ref 6.0–8.3)

## 2015-02-14 LAB — CBC WITH DIFFERENTIAL/PLATELET
Basophils Absolute: 0 10*3/uL (ref 0.0–0.1)
Basophils Relative: 0 % (ref 0–1)
EOS ABS: 1.3 10*3/uL — AB (ref 0.0–0.7)
EOS PCT: 19 % — AB (ref 0–5)
HCT: 37.8 % (ref 36.0–46.0)
HEMOGLOBIN: 13 g/dL (ref 12.0–15.0)
LYMPHS ABS: 2.6 10*3/uL (ref 0.7–4.0)
Lymphocytes Relative: 38 % (ref 12–46)
MCH: 29.4 pg (ref 26.0–34.0)
MCHC: 34.4 g/dL (ref 30.0–36.0)
MCV: 85.5 fL (ref 78.0–100.0)
MONO ABS: 0.4 10*3/uL (ref 0.1–1.0)
MONOS PCT: 6 % (ref 3–12)
MPV: 9 fL (ref 8.6–12.4)
Neutro Abs: 2.6 10*3/uL (ref 1.7–7.7)
Neutrophils Relative %: 37 % — ABNORMAL LOW (ref 43–77)
Platelets: 244 10*3/uL (ref 150–400)
RBC: 4.42 MIL/uL (ref 3.87–5.11)
RDW: 13.5 % (ref 11.5–15.5)
WBC: 6.9 10*3/uL (ref 4.0–10.5)

## 2015-02-14 MED ORDER — SUCRALFATE 1 GM/10ML PO SUSP: 1.0000 g | Freq: Two times a day (BID) | ORAL | Status: DC

## 2015-02-14 MED ORDER — OMEPRAZOLE 40 MG PO CPDR: 40.0000 mg | DELAYED_RELEASE_CAPSULE | Freq: Every day | ORAL | Status: DC

## 2015-02-15 NOTE — Progress Notes (Signed)
Subjective:    Patient ID: Kristen Lane, female    DOB: 1986-03-27, 29 y.o.   MRN: [id number]  HPI history of intermittent epigastric distress over the past 6 weeks She has a burning feeling with occasional pain that seems related to change in diet to foods that are high in fiber into smoothies that are very concentrated with fruits and protein boosters. This is increased in frequency and now makes her uncomfortable several hours a day. She has no nocturnal pain and no postprandial right upper quadrant cramping. There is some mild nausea but no vomiting. No diarrhea or constipation. No past history of reflux or ulcers. No history of irritable bowel. She's had no blood in her stools. No fever chills night sweats or weight loss over the past 6 months  She started 20 mg of omeprazole over-the-counter but got little relief after 4- 5 days. No history of NSAID use  Note last visit--the change to name brand Effexor resulted in resolution of her side effects  Review of Systems  Constitutional: Negative for activity change, fatigue and unexpected weight change.  HENT: Negative for postnasal drip, rhinorrhea and trouble swallowing.   Respiratory: Negative for cough, chest tightness and shortness of breath.   Cardiovascular: Negative for palpitations.  Genitourinary: Negative for difficulty urinating and menstrual problem.       NuvaRing  Neurological: Negative for headaches.  Psychiatric/Behavioral:       See past history--- is improved       Objective:   Physical Exam BP 96/60 mmHg  Pulse 73  Temp(Src) 98.1 F (36.7 C) (Oral)  Resp 16  Ht _0  (1.753 m)  Wt 114 lb 12.8 oz (52.073 kg)  BMI 16.95 kg/m2  SpO2 98%  LMP 02/08/2015 HEENT clear    No thyromegaly or lymphadenopathy Heart regular without murmur Lungs clear to auscultation Abdomen soft , nondistended with no organomegaly or masses but with mild tenderness in the right upper quadrant Extremities are clear Neurological  intact Mood good affect appropriate      Assessment & Plan:  Abdominal pain, epigastric - Plan: CBC with Differential/Platelet, Comprehensive metabolic panel  Meds ordered this encounter  Medications  . omeprazole (PRILOSEC) 40 MG capsule    Sig: Take 1 capsule (40 mg total) by mouth daily.    Dispense:  30 capsule    Refill:  3  . sucralfate (CARAFATE) 1 GM/10ML suspension    Sig: Take 10 mLs (1 g total) by mouth 2 (two) times daily. If needed for dyspepsia    Dispense:  240 mL    Refill:  1   If these medicines control her symptoms she will continue for 2 months and then slowly wean yourself off omeprazole If her symptoms return she needs endoscopy and right upper quadrant ultrasound I will call about her lab results   Addendum labs 02/15/2015 Results for orders placed or performed in visit on 02/14/15  CBC with Differential/Platelet  Result Value Ref Range   WBC 6.9 4.0 - 10.5 K/uL   RBC 4.42 3.87 - 5.11 MIL/uL   Hemoglobin 13.0 12.0 - 15.0 g/dL   HCT 37.8 36.0 - 46.0 %   MCV 85.5 78.0 - 100.0 fL   MCH 29.4 26.0 - 34.0 pg   MCHC 34.4 30.0 - 36.0 g/dL   RDW 13.5 11.5 - 15.5 %   Platelets 244 150 - 400 K/uL   MPV 9.0 8.6 - 12.4 fL   Neutrophils Relative % 37 (L) 43 - 77 %  Neutro Abs 2.6 1.7 - 7.7 K/uL   Lymphocytes Relative 38 12 - 46 %   Lymphs Abs 2.6 0.7 - 4.0 K/uL   Monocytes Relative 6 3 - 12 %   Monocytes Absolute 0.4 0.1 - 1.0 K/uL   Eosinophils Relative 19 (H) 0 - 5 %   Eosinophils Absolute 1.3 (H) 0.0 - 0.7 K/uL   Basophils Relative 0 0 - 1 %   Basophils Absolute 0.0 0.0 - 0.1 K/uL   Smear Review Criteria for review not met   Comprehensive metabolic panel  Result Value Ref Range   Sodium 140 135 - 145 mEq/L   Potassium 5.2 3.5 - 5.3 mEq/L   Chloride 101 96 - 112 mEq/L   CO2 24 19 - 32 mEq/L   Glucose, Bld 87 70 - 99 mg/dL   BUN 8 6 - 23 mg/dL   Creat 0.85 0.50 - 1.10 mg/dL   Total Bilirubin 0.5 0.2 - 1.2 mg/dL   Alkaline Phosphatase 42 39 - 117  U/L   AST 19 0 - 37 U/L   ALT 17 0 - 35 U/L   Total Protein 6.6 6.0 - 8.3 g/dL   Albumin 4.2 3.5 - 5.2 g/dL   Calcium 9.4 8.4 - 10.5 mg/dL

## 2015-02-27 ENCOUNTER — Other Ambulatory Visit: Payer: Self-pay | Admitting: Internal Medicine

## 2015-02-27 ENCOUNTER — Encounter: Payer: Self-pay | Admitting: Internal Medicine

## 2015-02-27 DIAGNOSIS — R1013 Epigastric pain: Secondary | ICD-10-CM

## 2015-03-13 ENCOUNTER — Other Ambulatory Visit: Payer: No Typology Code available for payment source

## 2015-03-13 ENCOUNTER — Ambulatory Visit (INDEPENDENT_AMBULATORY_CARE_PROVIDER_SITE_OTHER): Payer: No Typology Code available for payment source | Admitting: Gastroenterology

## 2015-03-13 ENCOUNTER — Encounter: Payer: Self-pay | Admitting: Gastroenterology

## 2015-03-13 VITALS — BP 90/68 | HR 68 | Ht 69.0 in | Wt 116.6 lb

## 2015-03-13 DIAGNOSIS — R109 Unspecified abdominal pain: Secondary | ICD-10-CM

## 2015-03-13 NOTE — Patient Instructions (Signed)
You will have labs checked today in the basement lab.  Please head down after you check out with the front desk  (celiac panel). You will be set up for an upper endoscopy for abdominal pains. Stay on once daily prilosec for now.

## 2015-03-13 NOTE — Progress Notes (Signed)
HPI: This is a very pleasant 29 year old woman whom I'm meeting for the first time, who was referred to me by Leandrew Koyanagi, MD  to evaluate  abdominal pain    Chief complaint is postprandial abdominal pain  Burning pains in mid abd, more intense recently.  Fruit more often than other foods will bring it on.  Burning in periumb.  Sometimes. Will last at least hours.  Symptoms began almost immediately after eating. Usually fruits, sometimes tomato sauces.  Has been going on 2 months.  Past 2 weeks has avoided fruits  Had been taking omeprazole once daily, for about a month.  In AM 40mg .  she is not sure if this is really helped. She has never really had heartburn. She tried eating fruits while on this for 2 weeks and again had the abdominal pain.  One cup coffee daily.  Weight down a bit 3-5 pounds.  No pyrosis.  Nausea once.  Not related to menstral cycle.  [place].  No NSAIDs, very rare.  Never dysphagia.  Labs 01/2015 normal CBC , normal CMET  Minor loose stools 2 weeks ago.  Not consistent.  No other real dietary issues.  She did start to eat a lot of smoothies shortly before symptoms.  This was for months  Mom with IBS  No correlation with new effexor.    Review of systems: Pertinent positive and negative review of systems were noted in the above HPI section. Complete review of systems was performed and was otherwise normal.   Past Medical History  Diagnosis Date  . Allergy   . Anemia   . Anxiety   . Asthma     Past Surgical History  Procedure Laterality Date  . Wisdom tooth extraction      Current Outpatient Prescriptions  Medication Sig Dispense Refill  . albuterol (PROVENTIL HFA;VENTOLIN HFA) 108 (90 BASE) MCG/ACT inhaler Inhale 2 puffs into the lungs every 6 (six) hours as needed for wheezing or shortness of breath. 1 Inhaler 3  . clonazePAM (KLONOPIN) 0.5 MG tablet Take 0.5 mg by mouth as needed for anxiety.    . EFFEXOR XR 150 MG 24  hr capsule Take 1 capsule (150 mg total) by mouth daily with breakfast. 90 capsule 1  . Multiple Vitamins-Calcium (ONE-A-DAY WOMENS FORMULA PO) Take 1 tablet by mouth daily.    Marland Kitchen NUVARING 0.12-0.015 MG/24HR vaginal ring insert 1 ring vaginally EVERY MONTH AS DIRECTED 1 each 0  . omeprazole (PRILOSEC) 40 MG capsule Take 1 capsule (40 mg total) by mouth daily. 30 capsule 3  . sucralfate (CARAFATE) 1 GM/10ML suspension Take 10 mLs (1 g total) by mouth 2 (two) times daily. If needed for dyspepsia 240 mL 1   No current facility-administered medications for this visit.    Allergies as of 03/13/2015  . (No Known Allergies)    Family History  Problem Relation Age of Onset  . Prostate cancer Father   . Breast cancer Maternal Grandmother   . Emphysema Maternal Grandfather   . Breast cancer Paternal Grandmother     History   Social History  . Marital Status: Single    Spouse Name: N/A  . Number of Children: N/A  . Years of Education: N/A   Occupational History  . Pysco Therapist    Social History Main Topics  . Smoking status: Never Smoker   . Smokeless tobacco: Never Used  . Alcohol Use: 0.0 oz/week    0 Standard drinks or equivalent per week  Comment: 4-5 drinks once a week  . Drug Use: No  . Sexual Activity: Yes    Birth Control/ Protection: Other-see comments     Comment: nuvaring   Other Topics Concern  . Not on file   Social History Narrative     Physical Exam: BP 90/68 mmHg  Pulse 68  Ht 5\' 9"  (1.753 m)  Wt 116 lb 9.6 oz (52.889 kg)  BMI 17.21 kg/m2  LMP 03/06/2015 (Approximate) Constitutional: generally well-appearing Psychiatric: alert and oriented x3 Eyes: extraocular movements intact Mouth: oral pharynx moist, no lesions Neck: supple no lymphadenopathy Cardiovascular: heart regular rate and rhythm Lungs: clear to auscultation bilaterally Abdomen: soft, nontender, nondistended, no obvious ascites, no peritoneal signs, normal bowel sounds Extremities:  no lower extremity edema bilaterally Skin: no lesions on visible extremities   Assessment and plan: 29 y.o. female with  postprandial abdominal pain  Most suspicious for upper GI pathology, gastric or duodenitis. She will have celiac sprue serology tested. We'll proceed with EGD at her soonest convenience. She is going to stay on proton pump inhibitor once daily for now. If the above testing is not helpful then I will turn to biliary workup with gallbladder ultrasound.   Owens Loffler, MD [facility] 03/13/2015, 11:20 AM  Cc: Leandrew Koyanagi, MD

## 2015-03-14 LAB — CELIAC PANEL 10
Endomysial Screen: NEGATIVE
GLIADIN IGA: 3 U (ref <20)
GLIADIN IGG: 1 U (ref <20)
IgA: 149 mg/dL (ref 69–380)
TISSUE TRANSGLUTAMINASE AB, IGA: 1 U/mL (ref <4)
Tissue Transglut Ab: 1 U/mL (ref <6)

## 2015-03-16 ENCOUNTER — Ambulatory Visit (AMBULATORY_SURGERY_CENTER): Payer: No Typology Code available for payment source | Admitting: Gastroenterology

## 2015-03-16 ENCOUNTER — Encounter: Payer: Self-pay | Admitting: Gastroenterology

## 2015-03-16 VITALS — BP 103/49 | HR 61 | Temp 98.7°F | Resp 14 | Ht 69.0 in | Wt 116.0 lb

## 2015-03-16 DIAGNOSIS — R109 Unspecified abdominal pain: Secondary | ICD-10-CM

## 2015-03-16 DIAGNOSIS — K2990 Gastroduodenitis, unspecified, without bleeding: Secondary | ICD-10-CM

## 2015-03-16 DIAGNOSIS — K2970 Gastritis, unspecified, without bleeding: Secondary | ICD-10-CM

## 2015-03-16 DIAGNOSIS — K2950 Unspecified chronic gastritis without bleeding: Secondary | ICD-10-CM | POA: Diagnosis not present

## 2015-03-16 MED ORDER — SODIUM CHLORIDE 0.9 % IV SOLN: 500.0000 mL | INTRAVENOUS | Status: DC

## 2015-03-16 NOTE — Progress Notes (Signed)
A/ox3 pleased with MAC, report to Jane RN 

## 2015-03-16 NOTE — Patient Instructions (Addendum)
YOU HAD AN ENDOSCOPIC PROCEDURE TODAY AT [facility]:   Refer to the procedure report that was given to you for any specific questions about what was found during the examination.  If the procedure report does not answer your questions, please call your gastroenterologist to clarify.  If you requested that your care partner not be given the details of your procedure findings, then the procedure report has been included in a sealed envelope for you to review at your convenience later.  YOU SHOULD EXPECT: Some feelings of bloating in the abdomen. Passage of more gas than usual.  Walking can help get rid of the air that was put into your GI tract during the procedure and reduce the bloating. If you had a lower endoscopy (such as a colonoscopy or flexible sigmoidoscopy) you may notice spotting of blood in your stool or on the toilet paper. If you underwent a bowel prep for your procedure, you may not have a normal bowel movement for a few days.  Please Note:  You might notice some irritation and congestion in your nose or some drainage.  This is from the oxygen used during your procedure.  There is no need for concern and it should clear up in a day or so.  SYMPTOMS TO REPORT IMMEDIATELY:     Following upper endoscopy (EGD)  Vomiting of blood or coffee ground material  New chest pain or pain under the shoulder blades  Painful or persistently difficult swallowing  New shortness of breath  Fever of 100F or higher  Black, tarry-looking stools  For urgent or emergent issues, a gastroenterologist can be reached at any hour by calling [phone].   DIET: Your first meal following the procedure should be a small meal and then it is ok to progress to your normal diet. Heavy or fried foods are harder to digest and may make you feel nauseous or bloated.  Likewise, meals heavy in dairy and vegetables can increase bloating.  Drink plenty of fluids but you should avoid alcoholic beverages  for 24 hours.  ACTIVITY:  You should plan to take it easy for the rest of today and you should NOT DRIVE or use heavy machinery until tomorrow (because of the sedation medicines used during the test).    FOLLOW UP: Our staff will call the number listed on your records the next business day following your procedure to check on you and address any questions or concerns that you may have regarding the information given to you following your procedure. If we do not reach you, we will leave a message.  However, if you are feeling well and you are not experiencing any problems, there is no need to return our call.  We will assume that you have returned to your regular daily activities without incident.  If any biopsies were taken you will be contacted by phone or by letter within the next 1-3 weeks.  Please call us at [phone] if you have not heard about the biopsies in 3 weeks.    SIGNATURES/CONFIDENTIALITY: You and/or your care partner have signed paperwork which will be entered into your electronic medical record.  These signatures attest to the fact that that the information above on your After Visit Summary has been reviewed and is understood.  Full responsibility of the confidentiality of this discharge information lies with you and/or your care-partner.   Gastritis information given.

## 2015-03-16 NOTE — Progress Notes (Signed)
Called to room to assist during endoscopic procedure.  Patient ID and intended procedure confirmed with present staff. Received instructions for my participation in the procedure from the performing physician.  

## 2015-03-16 NOTE — Op Note (Signed)
[facility]  [address] [place], [phone]   ENDOSCOPY PROCEDURE REPORT  PATIENT: Penne, Rosenstock  MR#: [id number] BIRTHDATE: December 22, 1985 , 29  yrs. old GENDER: female ENDOSCOPIST: Milus Banister, MD REFERRED BY:  Tami Lin, M.D. PROCEDURE DATE:  03/16/2015 PROCEDURE:  EGD w/ biopsy ASA CLASS:     Class I INDICATIONS:  post prandial epigastric pain. MEDICATIONS: Monitored anesthesia care and Propofol 120 mg IV TOPICAL ANESTHETIC: none  DESCRIPTION OF PROCEDURE: After the risks benefits and alternatives of the procedure were thoroughly explained, informed consent was obtained.  The LB [id number] endoscope was introduced through the mouth and advanced to the second portion of the duodenum , Without limitations.  The instrument was slowly withdrawn as the mucosa was fully examined.  There was mild non-specific mid and distal gastritis.  This was biopsied and sent to pathology.  The examination was otherwise normal.  Retroflexed views revealed no abnormalities.     The scope was then withdrawn from the patient and the procedure completed. COMPLICATIONS: There were no immediate complications.  ENDOSCOPIC IMPRESSION: There was mild non-specific mid and distal gastritis.  This was biopsied and sent to pathology.  The examination was otherwise normal  RECOMMENDATIONS: Await biopsy results: if these show H. pylori you will be started on appropriate antibiotics. If not, then we will arrange for abdominal ultrasound to check for gallstones.   eSigned:  Milus Banister, MD 03/16/2015 2:21 PM

## 2015-03-20 ENCOUNTER — Telehealth: Payer: Self-pay

## 2015-03-20 NOTE — Telephone Encounter (Signed)
Left a message at [phone] with the pt being identified, to call us back if any questions or concerns. maw

## 2015-04-04 ENCOUNTER — Other Ambulatory Visit: Payer: Self-pay

## 2015-04-04 DIAGNOSIS — R6881 Early satiety: Secondary | ICD-10-CM

## 2015-04-04 DIAGNOSIS — R109 Unspecified abdominal pain: Secondary | ICD-10-CM

## 2015-04-04 NOTE — Progress Notes (Signed)
You have been scheduled for an abdominal ultrasound at [facility] (1st floor of hospital) on 04/09/15 at 930 am. Please arrive 15 minutes prior to your appointment for registration. Make certain not to have anything to eat or drink 6 hours prior to your appointment. Should you need to reschedule your appointment, please contact radiology at [phone]. This test typically takes about 30 minutes to perform.  The patient has been notified of this information and all questions answered.

## 2015-04-09 ENCOUNTER — Ambulatory Visit (HOSPITAL_COMMUNITY)
Admission: RE | Admit: 2015-04-09 | Discharge: 2015-04-09 | Disposition: A | Discharge status: Home / Self Care | Payer: No Typology Code available for payment source | Source: Ambulatory Visit | Attending: Gastroenterology | Admitting: Gastroenterology

## 2015-04-09 DIAGNOSIS — R6881 Early satiety: Secondary | ICD-10-CM

## 2015-04-09 DIAGNOSIS — R109 Unspecified abdominal pain: Secondary | ICD-10-CM

## 2015-04-09 DIAGNOSIS — R1011 Right upper quadrant pain: Secondary | ICD-10-CM | POA: Diagnosis not present

## 2015-06-11 ENCOUNTER — Telehealth: Payer: Self-pay

## 2015-06-11 MED ORDER — DESVENLAFAXINE SUCCINATE ER 50 MG PO TB24: 50.0000 mg | ORAL_TABLET | Freq: Every day | ORAL | Status: DC

## 2015-06-11 MED ORDER — DESVENLAFAXINE SUCCINATE ER 100 MG PO TB24: 100.0000 mg | ORAL_TABLET | Freq: Every day | ORAL | Status: DC

## 2015-06-11 NOTE — Telephone Encounter (Signed)
Spoke to pt and advised that per Dr Laney Pastor, it is not necessary to taper off the Effexor - can switch directly to Pristiq once approved. She stated she has plenty of the 150 mg.

## 2015-06-11 NOTE — Telephone Encounter (Signed)
Pt emailed Dr Laney Pastor asking to switch to Pristiq bc of adverse SE of tachycardia from current Rx of Effexor 150 mg. She has also failed prozac, lexapro, venlafaxine, venlafaxine xr d/t same SE. Rxs sent for pristiq 50 and 100 mg in order to titrate up. Pt also stated that PA is needed for Pristiq. Completed on covermymeds. LMOM for pt to advise of status and asked her to CB if she wants me to send in a Rx for 75 Effexor until this is approved, but looks like pt has plenty of the Effexor 150.

## 2015-06-18 NOTE — Telephone Encounter (Signed)
Dr Laney Pastor, I have tried to get PA approved for treatment of both anxiety disorder, and for OCD and both were denied because "Pristiq is not approved by the FDA for the treatment of" either Dxs. I have put the denials in your box.

## 2015-06-29 ENCOUNTER — Other Ambulatory Visit: Payer: Self-pay | Admitting: Internal Medicine

## 2015-06-29 DIAGNOSIS — F429 Obsessive-compulsive disorder, unspecified: Secondary | ICD-10-CM

## 2015-06-29 DIAGNOSIS — R6884 Jaw pain: Secondary | ICD-10-CM

## 2015-06-29 MED ORDER — CLONAZEPAM 0.5 MG PO TABS: 0.5000 mg | ORAL_TABLET | ORAL | Status: DC | PRN

## 2015-07-01 ENCOUNTER — Other Ambulatory Visit: Payer: Self-pay | Admitting: Internal Medicine

## 2015-07-04 ENCOUNTER — Other Ambulatory Visit: Payer: Self-pay | Admitting: Internal Medicine

## 2015-07-04 ENCOUNTER — Telehealth: Payer: Self-pay

## 2015-07-04 MED ORDER — CLONAZEPAM 0.5 MG PO TABS: 0.5000 mg | ORAL_TABLET | ORAL | Status: DC | PRN

## 2015-07-04 NOTE — Telephone Encounter (Signed)
Pharm called to clarify sig for clonazepam. Dr Laney Pastor, did you intend to decrease pt's dose to just once QD as needed, or were you responding to a Rx req already in system? Pharm stated that the prev doctor wrote Rx for up to 2 times daily prn with #60. Please advise.

## 2015-07-04 NOTE — Telephone Encounter (Signed)
Did change to #30--i think we called them today

## 2015-07-04 NOTE — Progress Notes (Signed)
Reordered Meds ordered this encounter  Medications  . clonazePAM (KLONOPIN) 0.5 MG tablet    Sig: Take 1 tablet (0.5 mg total) by mouth as needed for anxiety.    Dispense:  30 tablet    Refill:  5    [id number] [place], [place] [Patient Preferred]  [phone]

## 2015-07-05 NOTE — Telephone Encounter (Signed)
Pharm called back and I advised Dr Laney Pastor did decrease to #30. They will run it as 30 day supply and just 1 QD.

## 2015-07-10 DIAGNOSIS — R6884 Jaw pain: Secondary | ICD-10-CM | POA: Insufficient documentation

## 2015-07-10 MED ORDER — CYCLOBENZAPRINE HCL 10 MG PO TABS: 10.0000 mg | ORAL_TABLET | Freq: Every day | ORAL | Status: DC

## 2015-07-10 MED ORDER — MELOXICAM 15 MG PO TABS: 15.0000 mg | ORAL_TABLET | Freq: Every day | ORAL | Status: DC

## 2015-07-10 NOTE — Progress Notes (Signed)
OCD (obsessive compulsive disorder)  Pain in bone of jaw  Meds ordered this encounter  Medications  . meloxicam (MOBIC) 15 MG tablet    Sig: Take 1 tablet (15 mg total) by mouth daily.    Dispense:  30 tablet    Refill:  0  . cyclobenzaprine (FLEXERIL) 10 MG tablet    Sig: Take 1 tablet (10 mg total) by mouth at bedtime.    Dispense:  30 tablet    Refill:  0   ?PT at [facility] or [facility]

## 2015-07-26 ENCOUNTER — Other Ambulatory Visit: Payer: Self-pay | Admitting: Internal Medicine

## 2015-07-31 ENCOUNTER — Other Ambulatory Visit: Payer: Self-pay | Admitting: Internal Medicine

## 2015-07-31 MED ORDER — EFFEXOR XR 150 MG PO CP24: 150.0000 mg | ORAL_CAPSULE | Freq: Every day | ORAL | Status: DC

## 2015-07-31 NOTE — Progress Notes (Signed)
P#1 ocd Stable on meds works couns Ref ok Meds ordered this encounter  Medications  . EFFEXOR XR 150 MG 24 hr capsule    Sig: Take 1 capsule (150 mg total) by mouth daily with breakfast.    Dispense:  30 capsule    Refill:  5    DISPENSE AS NAME BRAND ONLY PLEASE

## 2015-09-17 ENCOUNTER — Encounter: Payer: Self-pay | Admitting: Internal Medicine

## 2015-10-28 ENCOUNTER — Other Ambulatory Visit: Payer: Self-pay | Admitting: Internal Medicine

## 2015-10-28 NOTE — Progress Notes (Signed)
Needs new prior auth effexor Go to www.covermymeds.com/epa/bcbsnc

## 2015-10-29 NOTE — Progress Notes (Signed)
Yes, I have the following notes, copied from OV notes:   "Has done extremely well over the last 2 years. 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 Has had some medication side effects and problems with insurance coverage over medications. She originally responded(Dr Lennie Odor) to Effexor brand name extended release(AFTER PROZAC) and then had to be switched to regular Effexor which caused her to have loss of control of obsessions as well as having palpitations. Her level of anxiety was increased as well. This happened to some degree when she was switched to the generic brand of the extended release even when increased to 225 mg she only increase the side effects and didn't increase the control of obsessive thinking. Over the last 2 months at 225 mg of extended release generic she has experienced frequent pounding heart rates that are random and without chest pain or shortness of breath. She may have the specifically after eating at other times as well. The pounding will last for about 30 minutes. A cup need by epigastric pain at times. No reflux, no nausea, no diarrhea. She has reduce his dose to 150 mg over the last 2 weeks and those symptoms have resolved but she is having more trouble controlling her obsessions. If possible she wants to resume the brand name treatment".  I will complete the new PA.  Called pharm who reported pt's new BCBS ID # is [id number], and completed PA on covermymeds. Pending.  PA was approved and pt notified on 11/01/15.

## 2015-10-30 ENCOUNTER — Telehealth: Payer: Self-pay

## 2015-10-30 NOTE — Telephone Encounter (Signed)
Pt called to update Insurance... (I am unfamiliar with how to do this with out checking-in/scheduling)  BCBS- ID: [id number]: [id number]

## 2015-11-01 NOTE — Telephone Encounter (Signed)
A PA was needed for name brand Effexor for new insurance. I completed it on covermymeds and received approval through 10/19/2038. Notified pt.

## 2015-12-03 ENCOUNTER — Other Ambulatory Visit: Payer: Self-pay | Admitting: Internal Medicine

## 2015-12-03 MED ORDER — VENLAFAXINE HCL ER 150 MG PO CP24: 150.0000 mg | ORAL_CAPSULE | Freq: Every day | ORAL | Status: DC

## 2015-12-12 ENCOUNTER — Ambulatory Visit (INDEPENDENT_AMBULATORY_CARE_PROVIDER_SITE_OTHER): Payer: BLUE CROSS/BLUE SHIELD | Admitting: Family Medicine

## 2015-12-12 VITALS — BP 108/68 | HR 76 | Temp 97.5°F | Resp 16 | Ht 68.0 in | Wt 114.4 lb

## 2015-12-12 DIAGNOSIS — R197 Diarrhea, unspecified: Secondary | ICD-10-CM

## 2015-12-12 NOTE — Progress Notes (Signed)
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 Approximate 6 days ago she developed diarrhea which lasted several days and seemed to be getting better so that she advance her diet on Sunday. Then Monday and Tuesday and again today she's had diarrhea with yellow watery stools.  She's had no fever or cramps. She's also had no bleeding.  Patient says that she's not dizzy when she stands up but feels weak and has aches in her muscles.  Objective: No acute distress but the patient is thin and pale BP 108/68 mmHg  Pulse 76  Temp(Src) 97.5 F (36.4 C) (Oral)  Resp 16  Ht 5\' 8"  (1.727 m)  Wt 114 lb 6.4 oz (51.891 kg)  BMI 17.40 kg/m2  SpO2 96%  LMP 11/21/2015 HEENT: Unremarkable Chest: Clear Abdomen: Hyperactive bowel sounds, nontender abdomen with no masses or HSM.   This chart was scribed in my presence and reviewed by me personally.    ICD-9-CM ICD-10-CM   1. Diarrhea, unspecified type 787.91 R19.7 Gastrointestinal Panel by PCR , Stool     Signed, Robyn Haber, MD

## 2015-12-12 NOTE — Addendum Note (Signed)
Addended by: Marin Roberts on: 12/12/2015 04:38 PM   Modules accepted: Orders

## 2015-12-12 NOTE — Patient Instructions (Signed)
You need to stay on sips of clear liquids today with occasional saltine cracker. If this stays down and you do not have diarrhea for the next 12 hours, then you can try toast.  After that, he can progress her diet to include some scrambled eggs. Avoid milk-containing products for the next 48 hours.  You can pick up some over-the-counter Imodium but take it only twice a day as needed while the diarrheas particularly bad.  She also need to get some Align or Culturelle to repopulate biome of the intestine.  Take this twice a day.

## 2015-12-14 NOTE — Addendum Note (Signed)
Addended by: Frutoso Chase A on: 12/14/2015 12:46 PM   Modules accepted: Orders

## 2015-12-17 LAB — GASTROINTESTINAL PATHOGEN PANEL PCR
C. difficile Tox A/B, PCR: NEGATIVE
Campylobacter, PCR: NEGATIVE
Cryptosporidium, PCR: NEGATIVE
E coli (ETEC) LT/ST PCR: NEGATIVE
E coli (STEC) stx1/stx2, PCR: NEGATIVE
E coli 0157, PCR: NEGATIVE
Giardia lamblia, PCR: NEGATIVE
Norovirus, PCR: NEGATIVE
Rotavirus A, PCR: NEGATIVE
Salmonella, PCR: NEGATIVE
Shigella, PCR: NEGATIVE

## 2016-09-05 IMAGING — US US ABDOMEN COMPLETE
1 series · 14 of 25 positions shown · non-contrast
Comparison: None.

CLINICAL DATA: Right upper quadrant pain

EXAM:
ULTRASOUND ABDOMEN COMPLETE

[Series 1: us abdomen complete · 0.15mm/px · 14 of 82 slices shown]
[im 1/82]
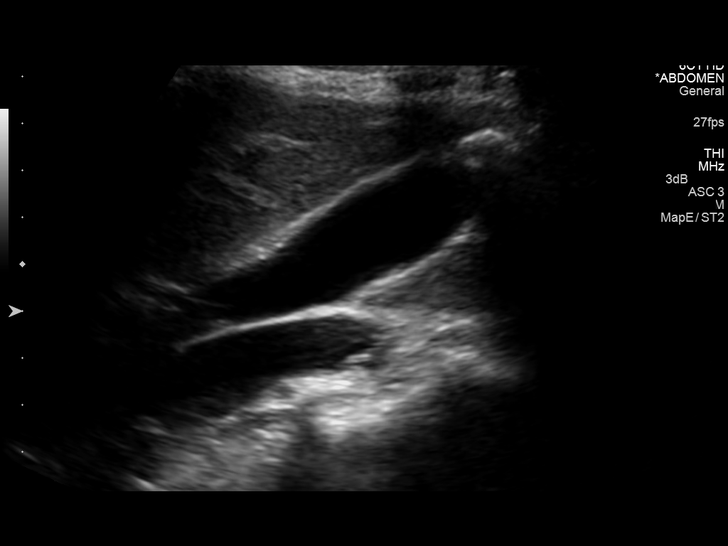
[im 7/82]
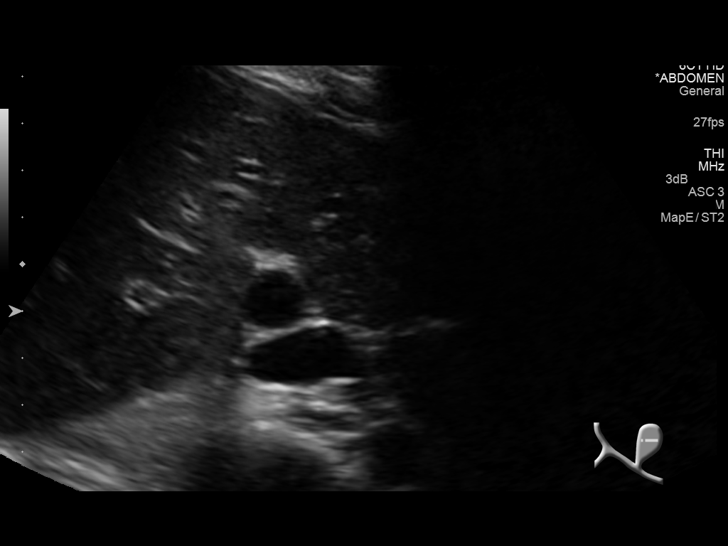
[im 14/82]
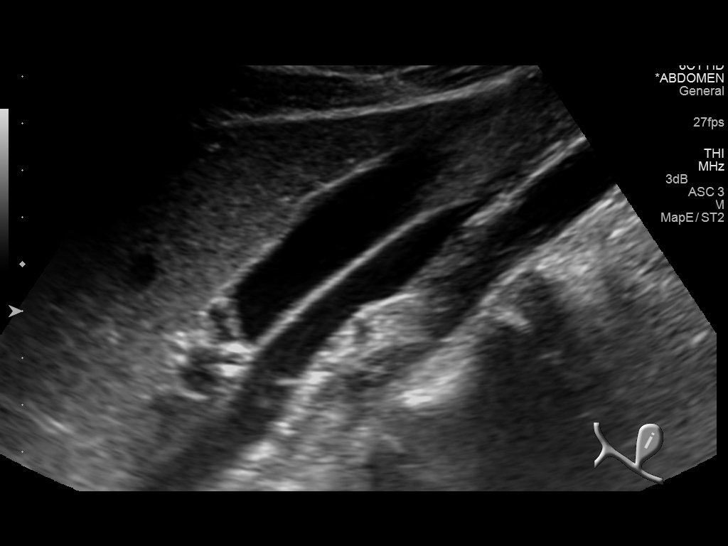
[im 21/82]
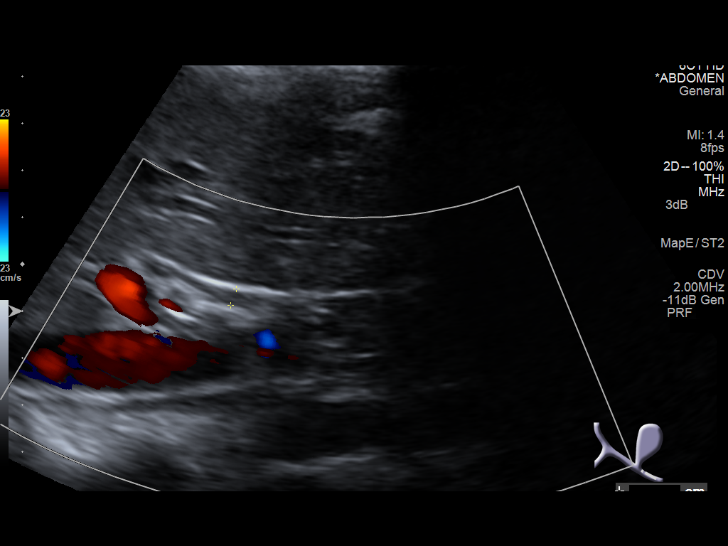
[im 28/82]
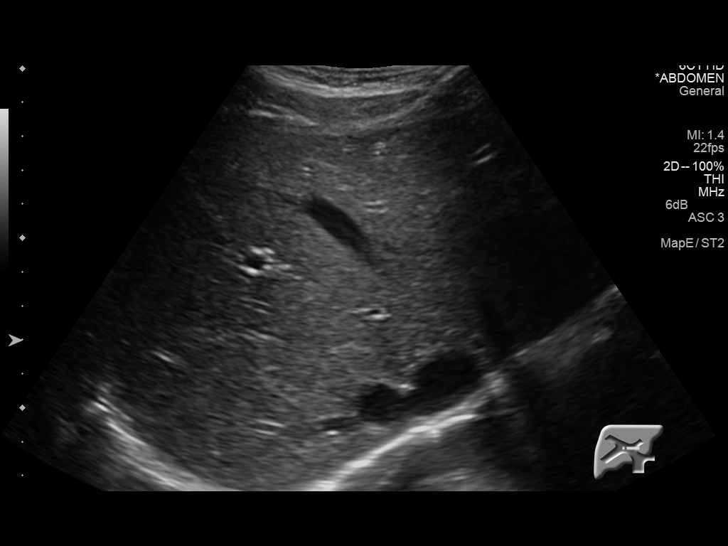
[im 31/82]
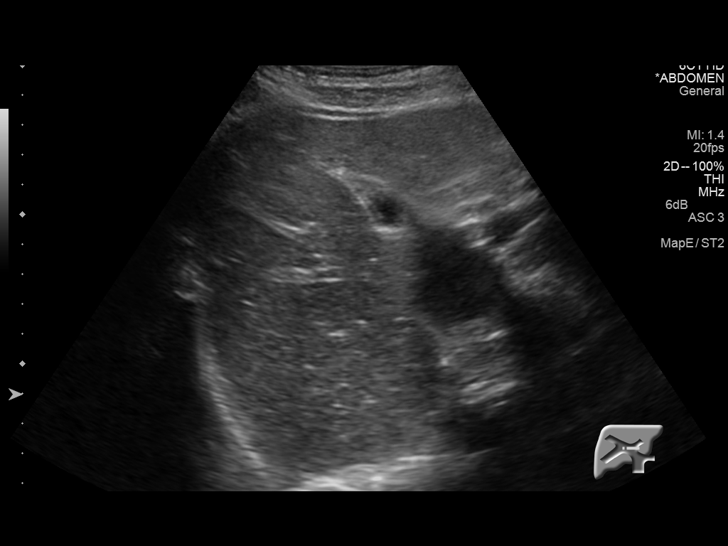
[im 38/82]
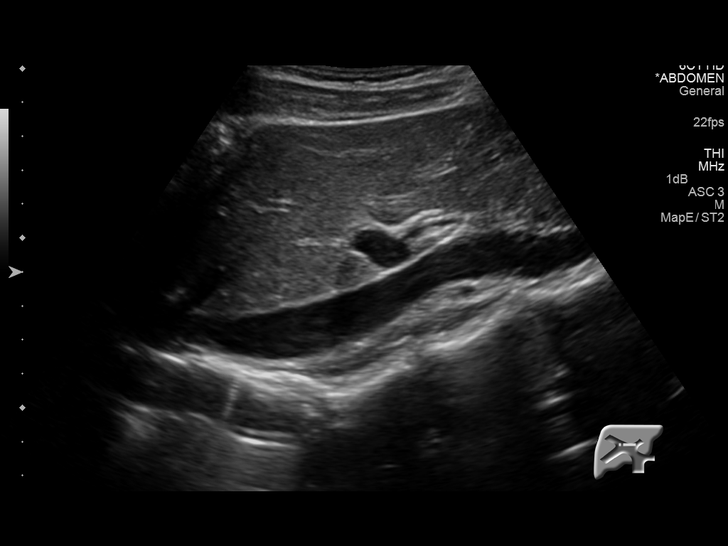
[im 44/82]
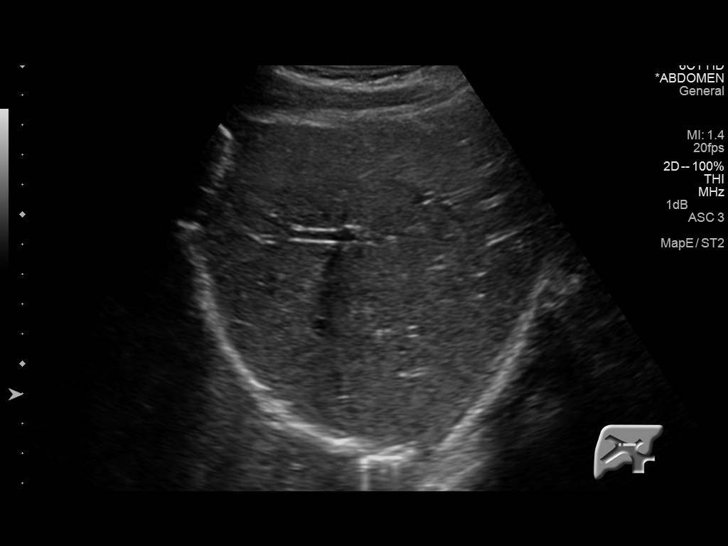
[im 51/82]
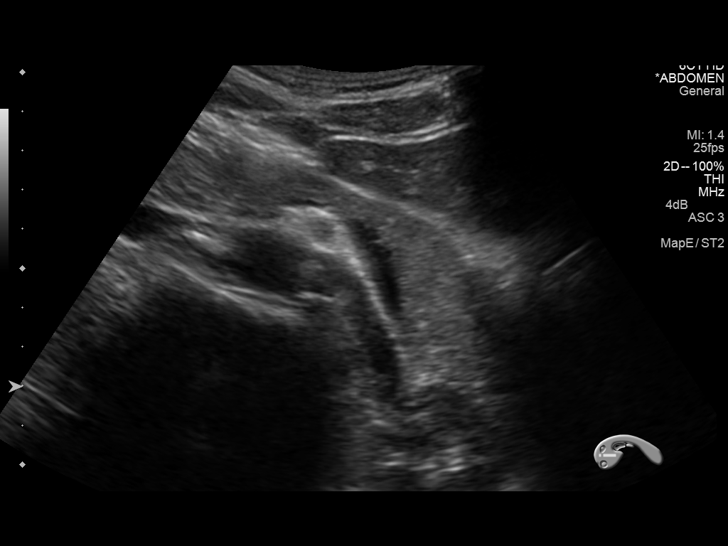
[im 55/82]
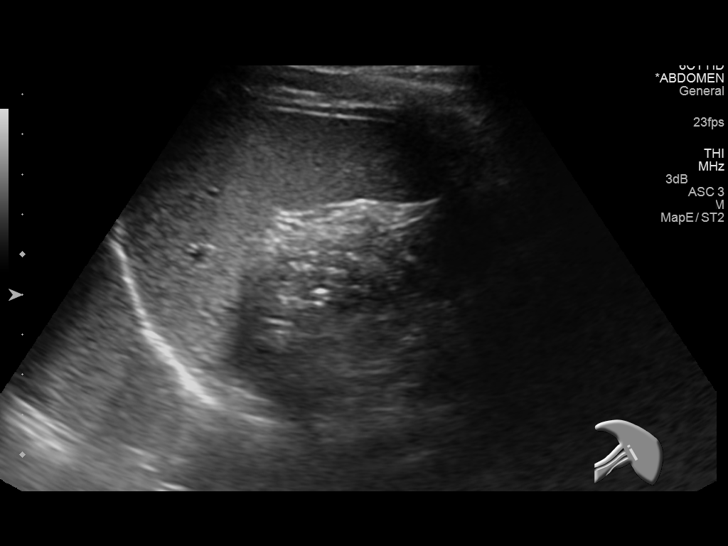
[im 61/82]
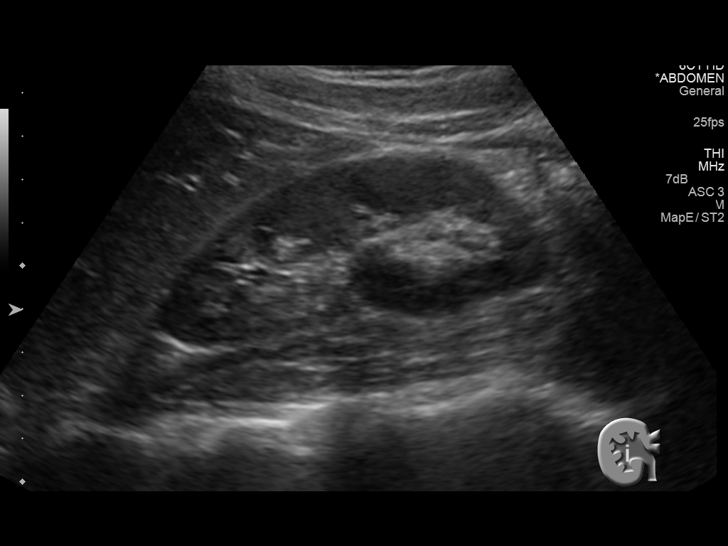
[im 68/82]
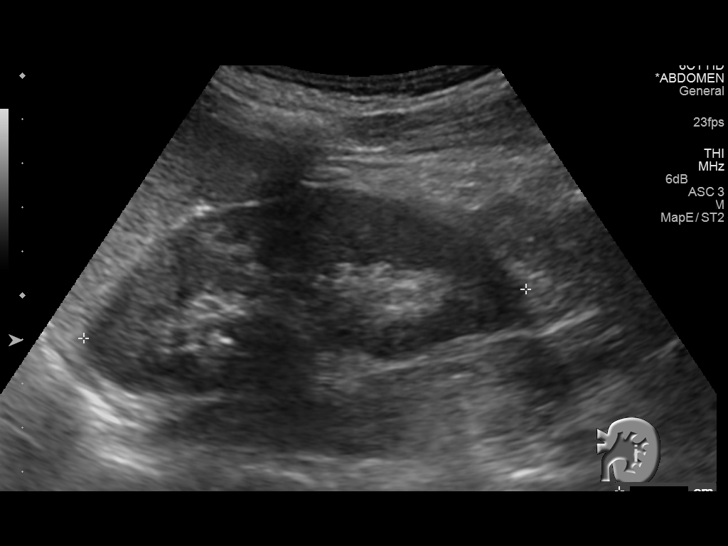
[im 75/82]
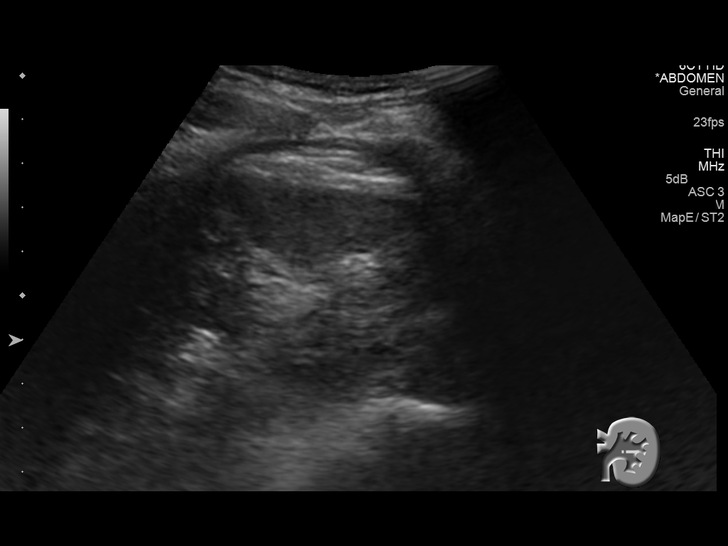
[im 82/82]
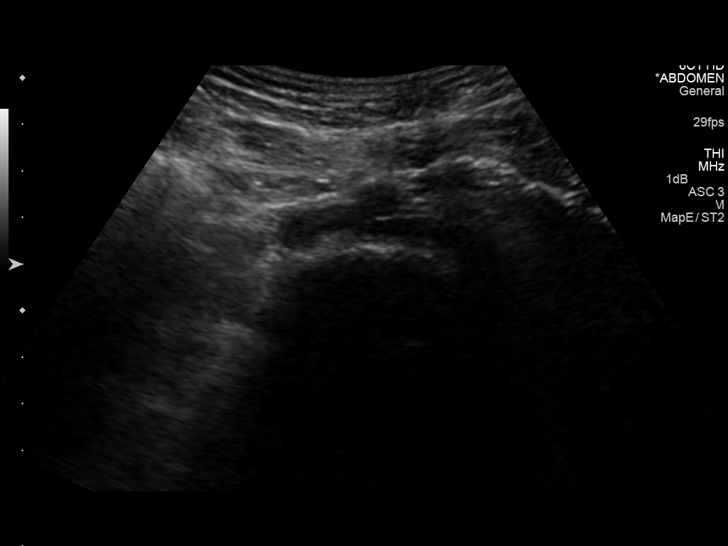

[14 of 25 positions shown; findings below may reference images not displayed]

FINDINGS: Gallbladder: No gallstones, gallbladder wall thickening, or
pericholecystic fluid. Negative sonographic Murphy sign.

Common bile duct: Diameter: 4 mm.

Liver: No focal lesion identified. Within normal limits in
parenchymal echogenicity.

IVC: No abnormality visualized.

Pancreas: Visualized portion unremarkable.

Spleen: Size and appearance within normal limits.

Right Kidney: Length: 9.6 cm.  No mass or hydronephrosis.

Left Kidney: Length: 10.1 cm.  No mass or hydronephrosis.

Abdominal aorta: No aneurysm visualized.

Other findings: None.
IMPRESSION: Negative abdominal ultrasound.

## 2017-05-15 ENCOUNTER — Encounter: Payer: Self-pay | Admitting: Physician Assistant

## 2017-05-15 ENCOUNTER — Ambulatory Visit (INDEPENDENT_AMBULATORY_CARE_PROVIDER_SITE_OTHER): Payer: BLUE CROSS/BLUE SHIELD | Admitting: Physician Assistant

## 2017-05-15 VITALS — BP 97/62 | HR 84 | Temp 98.0°F | Resp 16 | Ht 68.0 in | Wt 117.0 lb

## 2017-05-15 DIAGNOSIS — F419 Anxiety disorder, unspecified: Secondary | ICD-10-CM | POA: Diagnosis not present

## 2017-05-15 MED ORDER — DESVENLAFAXINE SUCCINATE ER 50 MG PO TB24: 150.0000 mg | ORAL_TABLET | Freq: Every day | ORAL | 3 refills | Status: DC

## 2017-05-15 NOTE — Progress Notes (Signed)
Patient ID: Kristen Lane, female     DOB: Jan 27, 1986, 31 y.o.    MRN: [id number]  PCP: Leandrew Koyanagi, MD (Inactive)  Chief Complaint  Patient presents with  . Anxiety    follow up on her anxiety medication  . Follow-up    Subjective:   This patient is new to me and presents for evaluation of anxiety. She was previously managed by Dr. Laney Pastor, who has retired. Prior to that, they discussed my becoming her PCP, but there was a delay, due to a temporary hold on new patients at this office..  Previously on venlafaxine (x 1 year) for anxiety and OCD-type thoughts. Things were going really well, felt like the inciting situation (end of a 6-year relationship) had resolved, so tapered herself off. Did have some physical effects of discontinuing it, and then some increased anxiety symptoms. Increased worry, emotional lability.  Established elsewhere briefly, and was started on Pristiq 50 mg, then increased to 100. GI symptoms resolved promptly, palpitations improved, and mental fog lifted. This drug was selected intentionally over resumption of the venlafaxine, as she plans conception in the next 6-12 months and wanted something that would be easier to taper off of.  Physical anxiety recurred leading up to her wedding, and persists now. [facility]. Has a family planning visit with Dr. Valentino Saxon scheduled soon.  Review of Systems  Constitutional: Negative.   HENT: Negative for sore throat.   Eyes: Negative for visual disturbance.  Respiratory: Negative for cough, chest tightness, shortness of breath and wheezing.   Cardiovascular: Negative for chest pain and palpitations.  Gastrointestinal: Negative for abdominal pain, diarrhea, nausea and vomiting.       Increased stool frequency  Genitourinary: Negative for dysuria, frequency, hematuria and urgency.  Musculoskeletal: Negative for arthralgias and myalgias.  Skin: Negative for rash.  Neurological: Negative for  dizziness, weakness and headaches.  Psychiatric/Behavioral: Negative for decreased concentration and dysphoric mood. The patient is nervous/anxious.      Prior to Admission medications   Medication Sig Start Date End Date Taking? Authorizing Provider  albuterol (PROVENTIL HFA;VENTOLIN HFA) 108 (90 BASE) MCG/ACT inhaler Inhale 2 puffs into the lungs every 6 (six) hours as needed for wheezing or shortness of breath. 08/10/14  Yes Leandrew Koyanagi, MD  clonazePAM (KLONOPIN) 0.5 MG tablet Take 1 tablet (0.5 mg total) by mouth as needed for anxiety. 07/04/15  Yes Leandrew Koyanagi, MD  desvenlafaxine (PRISTIQ) 100 MG 24 hr tablet Take 100 mg by mouth daily.   Yes [provider]  Multiple Vitamins-Calcium (ONE-A-DAY WOMENS FORMULA PO) Take 1 tablet by mouth daily.   Yes [provider]     No Known Allergies   Patient Active Problem List   Diagnosis Date Noted  . Pain in bone of jaw 07/10/2015  . Exercise-induced asthma 08/10/2014  . Anxiety 12/15/2011  . ANKLE PAIN, LEFT 04/01/2010     Family History  Problem Relation Age of Onset  . Prostate cancer Father   . Breast cancer Maternal Grandmother   . Emphysema Maternal Grandfather   . Breast cancer Paternal Grandmother      Social History   Social History  . Marital status: Married    Spouse name: Jori Moll  . Number of children: 0  . Years of education: Master's Degree   Occupational History  . [employer]   Social History Main Topics  . Smoking status: Never Smoker  . Smokeless tobacco: Never Used  .  Alcohol use 0.0 oz/week     Comment: 3 in last month  . Drug use: No  . Sexual activity: Yes    Birth control/ protection: Other-see comments     Comment: nuvaring   Other Topics Concern  . Not on file   Social History Narrative   Lives with her husband (married 04/18/2017).   Parents live nearby.   Sister lives in [place].         Objective:    Physical Exam  Constitutional: She is oriented to person, place, and time. She appears well-developed and well-nourished. She is active and cooperative. No distress.  BP 97/62   Pulse 84   Temp 98 F (36.7 C) (Oral)   Resp 16   Ht 5\' 8"  (1.727 m)   Wt 117 lb (53.1 kg)   LMP 05/05/2017   SpO2 97%   BMI 17.79 kg/m    Eyes: Conjunctivae are normal.  Pulmonary/Chest: Effort normal.  Neurological: She is alert and oriented to person, place, and time.  Psychiatric: She has a normal mood and affect. Her speech is normal and behavior is normal.       Assessment & Plan:   Problem List Items Addressed This Visit    Anxiety - Primary    INCREASE desvenlafaxine to 150 mg daily. If her symptoms worsen, plan to taper off and try an alternative, and will reach out to Dr. Valentino Saxon for her preferred antidepressants/anti-anxiety treatments during pregnancy.      Relevant Medications   desvenlafaxine (PRISTIQ) 50 MG 24 hr tablet       Return for re-evaluation as needed, pending response to increased Pristiq dose.Fara Chute, PA-C [facility]

## 2017-05-15 NOTE — Patient Instructions (Signed)
     IF you received an x-ray today, you will receive an invoice from [facility]. Please contact [facility] at [phone] with questions or concerns regarding your invoice.   IF you received labwork today, you will receive an invoice from [facility]. Please contact [facility] at [phone] with questions or concerns regarding your invoice.   Our billing staff will not be able to assist you with questions regarding bills from these companies.  You will be contacted with the lab results as soon as they are available. The fastest way to get your results is to activate your My Chart account. Instructions are located on the last page of this paperwork. If you have not heard from us regarding the results in 2 weeks, please contact this office.     

## 2017-05-15 NOTE — Assessment & Plan Note (Signed)
INCREASE desvenlafaxine to 150 mg daily. If her symptoms worsen, plan to taper off and try an alternative, and will reach out to Dr. Valentino Saxon for her preferred antidepressants/anti-anxiety treatments during pregnancy.

## 2017-05-26 ENCOUNTER — Other Ambulatory Visit: Payer: Self-pay | Admitting: Physician Assistant

## 2017-05-26 DIAGNOSIS — F419 Anxiety disorder, unspecified: Secondary | ICD-10-CM

## 2017-05-26 MED ORDER — DESVENLAFAXINE SUCCINATE ER 100 MG PO TB24: 100.0000 mg | ORAL_TABLET | Freq: Every day | ORAL | 0 refills | Status: DC

## 2017-05-26 MED ORDER — DESVENLAFAXINE SUCCINATE ER 50 MG PO TB24: 50.0000 mg | ORAL_TABLET | Freq: Every day | ORAL | 0 refills | Status: DC

## 2017-06-24 ENCOUNTER — Encounter: Payer: Self-pay | Admitting: Physician Assistant

## 2017-06-28 DIAGNOSIS — T63484A Toxic effect of venom of other arthropod, undetermined, initial encounter: Secondary | ICD-10-CM | POA: Diagnosis not present

## 2017-06-28 DIAGNOSIS — L509 Urticaria, unspecified: Secondary | ICD-10-CM | POA: Diagnosis not present

## 2017-06-29 ENCOUNTER — Emergency Department (HOSPITAL_COMMUNITY)
Admission: EM | Admit: 2017-06-29 | Discharge: 2017-06-29 | Disposition: A | Discharge status: Home / Self Care | Payer: BLUE CROSS/BLUE SHIELD | Attending: Emergency Medicine | Admitting: Emergency Medicine

## 2017-06-29 ENCOUNTER — Encounter (HOSPITAL_COMMUNITY): Payer: Self-pay | Admitting: Emergency Medicine

## 2017-06-29 DIAGNOSIS — Y929 Unspecified place or not applicable: Secondary | ICD-10-CM | POA: Insufficient documentation

## 2017-06-29 DIAGNOSIS — T782XXA Anaphylactic shock, unspecified, initial encounter: Secondary | ICD-10-CM | POA: Insufficient documentation

## 2017-06-29 DIAGNOSIS — Y939 Activity, unspecified: Secondary | ICD-10-CM | POA: Insufficient documentation

## 2017-06-29 DIAGNOSIS — Y999 Unspecified external cause status: Secondary | ICD-10-CM | POA: Diagnosis not present

## 2017-06-29 DIAGNOSIS — J45909 Unspecified asthma, uncomplicated: Secondary | ICD-10-CM | POA: Insufficient documentation

## 2017-06-29 DIAGNOSIS — T63441A Toxic effect of venom of bees, accidental (unintentional), initial encounter: Secondary | ICD-10-CM | POA: Insufficient documentation

## 2017-06-29 DIAGNOSIS — R21 Rash and other nonspecific skin eruption: Secondary | ICD-10-CM | POA: Diagnosis not present

## 2017-06-29 MED ORDER — PREDNISONE 20 MG PO TABS: 20.0000 mg | ORAL_TABLET | Freq: Every day | ORAL | 0 refills | Status: DC

## 2017-06-29 MED ORDER — LORATADINE 10 MG PO TABS
10.0000 mg | ORAL_TABLET | Freq: Once | ORAL | Status: AC
  Administered 2017-06-29: 10 mg via ORAL
  Filled 2017-06-29: qty 1

## 2017-06-29 MED ORDER — FAMOTIDINE IN NACL 20-0.9 MG/50ML-% IV SOLN
20.0000 mg | INTRAVENOUS | Status: AC
  Administered 2017-06-29: 20 mg via INTRAVENOUS
  Filled 2017-06-29: qty 50

## 2017-06-29 MED ORDER — EPINEPHRINE 0.3 MG/0.3ML IJ SOAJ: 0.3000 mg | Freq: Once | INTRAMUSCULAR | 1 refills | Status: DC | PRN

## 2017-06-29 MED ORDER — FAMOTIDINE 20 MG PO TABS: 40.0000 mg | ORAL_TABLET | Freq: Two times a day (BID) | ORAL | 0 refills | Status: DC

## 2017-06-29 MED ORDER — SODIUM CHLORIDE 0.9 % IV BOLUS (SEPSIS)
1000.0000 mL | Freq: Once | INTRAVENOUS | Status: AC
  Administered 2017-06-29: 1000 mL via INTRAVENOUS

## 2017-06-29 MED ORDER — DIPHENHYDRAMINE HCL 50 MG/ML IJ SOLN
25.0000 mg | Freq: Once | INTRAMUSCULAR | Status: AC
  Administered 2017-06-29: 25 mg via INTRAVENOUS
  Filled 2017-06-29: qty 1

## 2017-06-29 MED ORDER — METHYLPREDNISOLONE SODIUM SUCC 125 MG IJ SOLR
125.0000 mg | Freq: Once | INTRAMUSCULAR | Status: AC
  Administered 2017-06-29: 125 mg via INTRAVENOUS
  Filled 2017-06-29: qty 2

## 2017-06-29 NOTE — ED Triage Notes (Signed)
Pt had a brief episode of syncope after the sting

## 2017-06-29 NOTE — ED Provider Notes (Signed)
[facility] DEPT Provider Note   CSN: [id number] Arrival date & time: 06/29/17  0007    History   Chief Complaint Chief Complaint  Patient presents with  . Allergic Reaction    HPI Kristen Lane is a 31 y.o. female.  31 year old female percent to the emergency department after being stung by a bee/wasp at 1900. She states that she initially began to have hives and itching all over. She took 75 mg Benadryl and 20 mg Pepcid prior to arrival and tried to sleep shortly after. She was awoken in the evening with sensation of lightheadedness and nausea. She does not experience any vomiting. Has been called 911. EMS administered one round of epinephrine. Patient states that she feels improved since receiving an EpiPen. She has not had a history of anaphylaxis to bee venom previously. No associated SOB, wheezing, lip/tongue swelling, or difficulty swallowing.   The history is provided by the patient. No language interpreter was used.  Allergic Reaction  Presenting symptoms: itching and rash   Presenting symptoms: no difficulty breathing, no difficulty swallowing and no wheezing   Severity:  Moderate Duration:  5 hours Context: insect bite/sting (bee/wasp sting at 1900)   Relieved by:  Nothing Ineffective treatments:  Antihistamines (No relief with Benadryl or Pepcid)   Past Medical History:  Diagnosis Date  . Allergy   . Anemia   . Anxiety   . Asthma     Patient Active Problem List   Diagnosis Date Noted  . Pain in bone of jaw 07/10/2015  . Exercise-induced asthma 08/10/2014  . Anxiety 12/15/2011  . ANKLE PAIN, LEFT 04/01/2010    Past Surgical History:  Procedure Laterality Date  . WISDOM TOOTH EXTRACTION      OB History    No data available       Home Medications    Prior to Admission medications   Medication Sig Start Date End Date Taking? Authorizing Provider  albuterol (PROVENTIL HFA;VENTOLIN HFA) 108 (90 BASE) MCG/ACT inhaler Inhale 2 puffs into the  lungs every 6 (six) hours as needed for wheezing or shortness of breath. 08/10/14  Yes Leandrew Koyanagi, MD  clonazePAM (KLONOPIN) 0.5 MG tablet Take 1 tablet (0.5 mg total) by mouth as needed for anxiety. 07/04/15  Yes Leandrew Koyanagi, MD  desvenlafaxine (PRISTIQ) 100 MG 24 hr tablet Take 1 tablet (100 mg total) by mouth daily. add this to a 50mg  capsule for a total of 150mg  dosage 05/26/17  Yes Weber, Sarah L, PA-C  desvenlafaxine (PRISTIQ) 50 MG 24 hr tablet Take 1 tablet (50 mg total) by mouth daily. add this to a 100mg  capsule for a total of 150mg  dosage 05/26/17  Yes Weber, Sarah L, PA-C  Multiple Vitamins-Calcium (ONE-A-DAY WOMENS FORMULA PO) Take 1 tablet by mouth daily.   Yes [provider]  NUVARING 0.12-0.015 MG/24HR vaginal ring Place 1 each vaginally every 28 (twenty-eight) days.  05/07/17  Yes [provider]  EPINEPHrine (EPIPEN 2-PAK) 0.3 mg/0.3 mL IJ SOAJ injection Inject 0.3 mLs (0.3 mg total) into the muscle once as needed (for severe allergic reaction). CAll 911 immediately if you have to use this medicine 06/29/17   Antonietta Breach, PA-C  famotidine (PEPCID) 20 MG tablet Take 2 tablets (40 mg total) by mouth 2 (two) times daily. 06/29/17   Antonietta Breach, PA-C  predniSONE (DELTASONE) 20 MG tablet Take 1-3 tablets (20-60 mg total) by mouth daily. Take 60mg  by mouth daily for 1 day, 40 mg by mouth daily for 2  days, then 20mg  by mouth daily for 3 days 06/29/17   Antonietta Breach, PA-C    Family History Family History  Problem Relation Age of Onset  . Prostate cancer Father   . Breast cancer Maternal Grandmother   . Emphysema Maternal Grandfather   . Breast cancer Paternal Grandmother     Social History Social History  Substance Use Topics  . Smoking status: Never Smoker  . Smokeless tobacco: Never Used  . Alcohol use 0.0 oz/week     Comment: 3 in last month     Allergies   Bee venom   Review of Systems Review of Systems  HENT: Negative for trouble  swallowing.   Respiratory: Negative for wheezing.   Skin: Positive for itching and rash.  Ten systems reviewed and are negative for acute change, except as noted in the HPI.    Physical Exam Updated Vital Signs BP (!) 93/53   Pulse 63   Temp 98.3 F (36.8 C)   SpO2 98%   Physical Exam  Constitutional: She is oriented to person, place, and time. She appears well-developed and well-nourished. No distress.  Nontoxic and in NAD  HENT:  Head: Normocephalic and atraumatic.  No angioedema. Patient tolerating secretions without difficulty.  Eyes: Conjunctivae and EOM are normal. No scleral icterus.  Neck: Normal range of motion.  No stridor  Cardiovascular: Normal rate, regular rhythm and intact distal pulses.   Pulmonary/Chest: Effort normal. No respiratory distress. She has no wheezes. She has no rales.  Respirations even and unlabored. Lungs CTAB.  Musculoskeletal: Normal range of motion.  Neurological: She is alert and oriented to person, place, and time. She exhibits normal muscle tone. Coordination normal.  Skin: Skin is warm and dry. Rash noted. She is not diaphoretic. No pallor.  Scattered, blanching urticaria  Psychiatric: She has a normal mood and affect. Her behavior is normal.  Nursing note and vitals reviewed.    ED Treatments / Results  Labs (all labs ordered are listed, but only abnormal results are displayed) Labs Reviewed - No data to display  EKG  EKG Interpretation None       Radiology No results found.  Procedures Procedures (including critical care time)  Medications Ordered in ED Medications  sodium chloride 0.9 % bolus 1,000 mL (0 mLs Intravenous Stopped 06/29/17 0139)  methylPREDNISolone sodium succinate (SOLU-MEDROL) 125 mg/2 mL injection 125 mg (125 mg Intravenous Given 06/29/17 0035)  diphenhydrAMINE (BENADRYL) injection 25 mg (25 mg Intravenous Given 06/29/17 0140)  famotidine (PEPCID) IVPB 20 mg premix (0 mg Intravenous Stopped 06/29/17 0210)   loratadine (CLARITIN) tablet 10 mg (10 mg Oral Given 06/29/17 0358)    1:30 AM Patient reassessed. She has not had any clinical worsening. She does have persistent hives. Benadryl and Pepcid last taken between 2000-2100. We will give additional IV Benadryl and IV Pepcid. No indication for subsequent epinephrine. Solu-Medrol given.  4:00 AM Patient reassessed at 3:45 AM. She is sleeping comfortably. Upon waking she states that her symptoms feel improved, overall. She denies any difficulty breathing or swallowing. Urticaria has decreased in redness, but not completely subsided. Vital signs stable. Will provide one dose of Claritin. Have discussed continued management on an outpatient basis. Patient expresses comfort with this. Patient given prescription for an EpiPen as well as a prednisone taper. She has been recommended to continue Benadryl and Pepcid as prescribed, PRN.    Initial Impression / Assessment and Plan / ED Course  I have reviewed the triage vital signs and  the nursing notes.  Pertinent labs & imaging results that were available during my care of the patient were reviewed by me and considered in my medical decision making (see chart for details).     31 year old female presenting to the emergency department after an anaphylactic reaction to a bee sting. Epinephrine given by EMS prior to arrival. The patient has been monitored for 4 hours in the emergency department without clinical decompensation. She is hemodynamically stable, in no respiratory distress, and denies the feeling of throat closing or difficulty breathing/SOB. Pt is to follow up with her PCP. She has been counseled on supportive outpatient care measures. Return precautions discussed and provided. Patient discharged in stable condition with no unaddressed concerns.  Vitals:   06/29/17 0045 06/29/17 0130 06/29/17 0255 06/29/17 0330  BP: (!) 105/59 (!) 99/50 (!) 91/55 (!) 93/53  Pulse: 85 72 78 63  Temp:      SpO2:  100% 100% 99% 98%   *BP reviewed; suspect this to be baseline given patient's stature and petite frame. She has not expressed symptoms of hypotension since arrival.   Final Clinical Impressions(s) / ED Diagnoses   Final diagnoses:  Anaphylaxis, initial encounter  Allergic reaction to bee sting    New Prescriptions New Prescriptions   EPINEPHRINE (EPIPEN 2-PAK) 0.3 MG/0.3 ML IJ SOAJ INJECTION    Inject 0.3 mLs (0.3 mg total) into the muscle once as needed (for severe allergic reaction). CAll 911 immediately if you have to use this medicine   FAMOTIDINE (PEPCID) 20 MG TABLET    Take 2 tablets (40 mg total) by mouth 2 (two) times daily.   PREDNISONE (DELTASONE) 20 MG TABLET    Take 1-3 tablets (20-60 mg total) by mouth daily. Take 60mg  by mouth daily for 1 day, 40 mg by mouth daily for 2 days, then 20mg  by mouth daily for 3 days     Antonietta Breach, PA-C 06/29/17 0407    Varney Biles, MD 07/02/17 1609

## 2017-06-29 NOTE — ED Triage Notes (Signed)
Pt BIB [facility] for allergic reaction. approx 1900 pt was outside and stung by a bee. Noticed hives and took 75mg  benadryl and 2mg  famotide po. Did not help. EMS arrived and pt was hypotensive 16X systolic and feeling weak. They administered 0.3 mg Epi IM to patients left thigh

## 2017-06-29 NOTE — Discharge Instructions (Signed)
Continue taking 50mg  Benadryl every 6-8 hours and 40mg  Pepcid every 12 hours for persistent allergic reaction. We also recommend the use of a prednisone taper as prescribed. You may take Claritin or Zyrtec as an alternative to Benadryl if your symptoms remain mild, as this is a good nondrowsy option during the day. Follow-up with your primary care doctor to ensure resolution of symptoms.

## 2017-06-29 NOTE — ED Notes (Signed)
Pt still has hives on her bilateral medial interior thighs. Pt also has haves on the lateral surfaces of her neck. Pts torso is red but did not see papules

## 2017-07-03 ENCOUNTER — Encounter: Payer: Self-pay | Admitting: Physician Assistant

## 2017-07-03 DIAGNOSIS — F429 Obsessive-compulsive disorder, unspecified: Secondary | ICD-10-CM

## 2017-07-03 NOTE — Progress Notes (Signed)
Records received from Paac Ciinak at [facility], 07/30/2016-03/02/2017

## 2017-07-20 DIAGNOSIS — Z681 Body mass index (BMI) 19 or less, adult: Secondary | ICD-10-CM | POA: Diagnosis not present

## 2017-07-20 DIAGNOSIS — Z01419 Encounter for gynecological examination (general) (routine) without abnormal findings: Secondary | ICD-10-CM | POA: Diagnosis not present

## 2017-07-20 DIAGNOSIS — Z23 Encounter for immunization: Secondary | ICD-10-CM | POA: Diagnosis not present

## 2017-09-03 ENCOUNTER — Other Ambulatory Visit: Payer: Self-pay | Admitting: Physician Assistant

## 2017-09-03 DIAGNOSIS — F419 Anxiety disorder, unspecified: Secondary | ICD-10-CM

## 2017-09-04 NOTE — Telephone Encounter (Signed)
Medication refill for Desvenlafaxine(Pristiq) / LOV 05/15/17 with Daphane Shepherd /

## 2017-09-05 MED ORDER — DESVENLAFAXINE SUCCINATE ER 100 MG PO TB24: 100.0000 mg | ORAL_TABLET | Freq: Every day | ORAL | 3 refills | Status: DC

## 2017-09-05 MED ORDER — DESVENLAFAXINE SUCCINATE ER 50 MG PO TB24: 50.0000 mg | ORAL_TABLET | Freq: Every day | ORAL | 3 refills | Status: DC

## 2017-11-26 ENCOUNTER — Encounter: Payer: Self-pay | Admitting: Physician Assistant

## 2017-11-30 ENCOUNTER — Ambulatory Visit (INDEPENDENT_AMBULATORY_CARE_PROVIDER_SITE_OTHER): Payer: BLUE CROSS/BLUE SHIELD | Admitting: Psychology

## 2017-11-30 DIAGNOSIS — F411 Generalized anxiety disorder: Secondary | ICD-10-CM

## 2018-01-14 ENCOUNTER — Ambulatory Visit (INDEPENDENT_AMBULATORY_CARE_PROVIDER_SITE_OTHER): Payer: BLUE CROSS/BLUE SHIELD | Admitting: Psychology

## 2018-01-14 DIAGNOSIS — F411 Generalized anxiety disorder: Secondary | ICD-10-CM

## 2018-01-21 ENCOUNTER — Encounter: Payer: Self-pay | Admitting: Physician Assistant

## 2018-01-28 ENCOUNTER — Ambulatory Visit: Payer: Self-pay | Admitting: *Deleted

## 2018-01-28 ENCOUNTER — Other Ambulatory Visit: Payer: Self-pay

## 2018-01-28 ENCOUNTER — Encounter: Payer: Self-pay | Admitting: Urgent Care

## 2018-01-28 ENCOUNTER — Ambulatory Visit (INDEPENDENT_AMBULATORY_CARE_PROVIDER_SITE_OTHER): Payer: BLUE CROSS/BLUE SHIELD | Admitting: Psychology

## 2018-01-28 ENCOUNTER — Ambulatory Visit: Payer: BLUE CROSS/BLUE SHIELD | Admitting: Urgent Care

## 2018-01-28 VITALS — BP 96/56 | HR 81 | Temp 97.7°F | Resp 16 | Ht 68.5 in | Wt 122.4 lb

## 2018-01-28 DIAGNOSIS — R319 Hematuria, unspecified: Secondary | ICD-10-CM | POA: Diagnosis not present

## 2018-01-28 DIAGNOSIS — F419 Anxiety disorder, unspecified: Secondary | ICD-10-CM

## 2018-01-28 DIAGNOSIS — F4323 Adjustment disorder with mixed anxiety and depressed mood: Secondary | ICD-10-CM

## 2018-01-28 DIAGNOSIS — N3090 Cystitis, unspecified without hematuria: Secondary | ICD-10-CM

## 2018-01-28 LAB — POCT URINALYSIS DIP (MANUAL ENTRY)
BILIRUBIN UA: NEGATIVE
Glucose, UA: NEGATIVE mg/dL
Ketones, POC UA: NEGATIVE mg/dL
NITRITE UA: NEGATIVE
PH UA: 6.5 (ref 5.0–8.0)
Protein Ur, POC: NEGATIVE mg/dL
Spec Grav, UA: 1.01 (ref 1.010–1.025)
Urobilinogen, UA: 0.2 E.U./dL

## 2018-01-28 LAB — POC MICROSCOPIC URINALYSIS (UMFC): Mucus: ABSENT

## 2018-01-28 LAB — POCT URINE PREGNANCY: Preg Test, Ur: NEGATIVE

## 2018-01-28 MED ORDER — NITROFURANTOIN MONOHYD MACRO 100 MG PO CAPS: 100.0000 mg | ORAL_CAPSULE | Freq: Two times a day (BID) | ORAL | 0 refills | Status: DC

## 2018-01-28 NOTE — Telephone Encounter (Signed)
Duplicate  Triage  Encounter   -  Delete  This encounter

## 2018-01-28 NOTE — Patient Instructions (Addendum)
Hydrate well with at least 2 liters (1 gallon) of water daily.    Urinary Tract Infection, Adult A urinary tract infection (UTI) is an infection of any part of the urinary tract, which includes the kidneys, ureters, bladder, and urethra. These organs make, store, and get rid of urine in the body. UTI can be a bladder infection (cystitis) or kidney infection (pyelonephritis). What are the causes? This infection may be caused by fungi, viruses, or bacteria. Bacteria are the most common cause of UTIs. This condition can also be caused by repeated incomplete emptying of the bladder during urination. What increases the risk? This condition is more likely to develop if:  You ignore your need to urinate or hold urine for long periods of time.  You do not empty your bladder completely during urination.  You wipe back to front after urinating or having a bowel movement, if you are female.  You are uncircumcised, if you are female.  You are constipated.  You have a urinary catheter that stays in place (indwelling).  You have a weak defense (immune) system.  You have a medical condition that affects your bowels, kidneys, or bladder.  You have diabetes.  You take antibiotic medicines frequently or for long periods of time, and the antibiotics no longer work well against certain types of infections (antibiotic resistance).  You take medicines that irritate your urinary tract.  You are exposed to chemicals that irritate your urinary tract.  You are female.  What are the signs or symptoms? Symptoms of this condition include:  Fever.  Frequent urination or passing small amounts of urine frequently.  Needing to urinate urgently.  Pain or burning with urination.  Urine that smells bad or unusual.  Cloudy urine.  Pain in the lower abdomen or back.  Trouble urinating.  Blood in the urine.  Vomiting or being less hungry than normal.  Diarrhea or abdominal pain.  Vaginal  discharge, if you are female.  How is this diagnosed? This condition is diagnosed with a medical history and physical exam. You will also need to provide a urine sample to test your urine. Other tests may be done, including:  Blood tests.  Sexually transmitted disease (STD) testing.  If you have had more than one UTI, a cystoscopy or imaging studies may be done to determine the cause of the infections. How is this treated? Treatment for this condition often includes a combination of two or more of the following:  Antibiotic medicine.  Other medicines to treat less common causes of UTI.  Over-the-counter medicines to treat pain.  Drinking enough water to stay hydrated.  Follow these instructions at home:  Take over-the-counter and prescription medicines only as told by your health care provider.  If you were prescribed an antibiotic, take it as told by your health care provider. Do not stop taking the antibiotic even if you start to feel better.  Avoid alcohol, caffeine, tea, and carbonated beverages. They can irritate your bladder.  Drink enough fluid to keep your urine clear or pale yellow.  Keep all follow-up visits as told by your health care provider. This is important.  Make sure to: ? Empty your bladder often and completely. Do not hold urine for long periods of time. ? Empty your bladder before and after sex. ? Wipe from front to back after a bowel movement if you are female. Use each tissue one time when you wipe. Contact a health care provider if:  You have back pain.  You have a fever.  You feel nauseous or vomit.  Your symptoms do not get better after 3 days.  Your symptoms go away and then return. Get help right away if:  You have severe back pain or lower abdominal pain.  You are vomiting and cannot keep down any medicines or water. This information is not intended to replace advice given to you by your health care provider. Make sure you discuss any  questions you have with your health care provider. Document Released: 07/16/2005 Document Revised: 03/19/2016 Document Reviewed: 08/27/2015 Elsevier Interactive Patient Education  2018 [employer].     IF you received an x-ray today, you will receive an invoice from [facility]. Please contact [facility] at [phone] with questions or concerns regarding your invoice.   IF you received labwork today, you will receive an invoice from [facility]. Please contact [facility] at [phone] with questions or concerns regarding your invoice.   Our billing staff will not be able to assist you with questions regarding bills from these companies.  You will be contacted with the lab results as soon as they are available. The fastest way to get your results is to activate your My Chart account. Instructions are located on the last page of this paperwork. If you have not heard from [place] regarding the results in 2 weeks, please contact this office.

## 2018-01-28 NOTE — Telephone Encounter (Signed)
Pt  Has Symptoms  Of  A  Possible  Uti .  Appointment  Made  Today  With Jaynee Eagles     Reason for Disposition . Urinating more frequently than usual (i.e., frequency)  Answer Assessment - Initial Assessment Questions 1. SYMPTOM: "What's the main symptom you're concerned about?" (e.g., frequency, incontinence)    Cloudy  Urine    Low  Bladder pain   Hematuria   Noticed  Odor   2. ONSET: "When did the  ________  start?"      3  Days  Ago    3. PAIN: "Is there any pain?" If so, ask: "How bad is it?" (Scale: 1-10; mild, moderate, severe)        Mild    4. CAUSE: "What do you think is causing the symptoms?"       Possibly a uti   5. OTHER SYMPTOMS: "Do you have any other symptoms?" (e.g., fever, flank pain, blood in urine, pain with urination)     Chills  At  Night      6. PREGNANCY: "Is there any chance you are pregnant?" "When was your last menstrual period?"       LMP   March 30  Protocols used: URINARY [facility]

## 2018-01-28 NOTE — Progress Notes (Signed)
MRN: [id number] DOB: 12/21/1985  Subjective:   Kristen Lane is a 32 y.o. female presenting for 3 day history of hematuria, pelvic pain and cloudy malordorous urine, nausea. Has tried hydrating better.  She also notes that she is coming off of Pristiq.  Feels like she is having withdrawal symptoms from coming off this medication.  Reports some mild nausea and general malaise.  Denies fever, dysuria, urinary frequency, urinary urgency, flank pain, genital rash, genital irritation and vaginal discharge, vomiting.   Kristen Lane has a current medication list which includes the following prescription(s): albuterol, epinephrine, clonazepam, desvenlafaxine, desvenlafaxine, famotidine, multiple vitamins-calcium, nuvaring, and prednisone. Patient is allergic to bee venom. Kristen Lane  has a past medical history of Allergy, Anemia, Anxiety, and Asthma. Also  has a past surgical history that includes Wisdom tooth extraction.  Objective:   Vitals: BP (!) 96/56 (BP Location: Right Arm)   Pulse 81   Temp 97.7 F (36.5 C) (Oral)   Resp 16   Ht 5' 8.5" (1.74 m)   Wt 122 lb 6.4 oz (55.5 kg)   LMP 01/13/2018   SpO2 100%   BMI 18.34 kg/m   Physical Exam  Constitutional: She is oriented to person, place, and time. She appears well-developed and well-nourished.  HENT:  Mouth/Throat: Oropharynx is clear and moist.  Eyes: No scleral icterus.  Cardiovascular: Normal rate, regular rhythm and intact distal pulses. Exam reveals no gallop and no friction rub.  No murmur heard. Pulmonary/Chest: No respiratory distress. She has no wheezes. She has no rales.  Abdominal: Soft. Bowel sounds are normal. She exhibits no distension and no mass. There is no tenderness.  No CVA tenderness.  Musculoskeletal: She exhibits no edema.  Neurological: She is alert and oriented to person, place, and time.  Skin: Skin is warm and dry. No rash noted. No erythema. No pallor.  Psychiatric: She has a normal mood and affect.   Results  for orders placed or performed in visit on 01/28/18 (from the past 24 hour(s))  POCT Microscopic Urinalysis ([facility])     Status: Abnormal   Collection Time: 01/28/18  3:04 PM  Result Value Ref Range   WBC,UR,HPF,POC Moderate (A) None WBC/hpf   RBC,UR,HPF,POC None None RBC/hpf   Bacteria None None, Too numerous to count   Mucus Absent Absent   Epithelial Cells, UR Per Microscopy Few (A) None, Too numerous to count cells/hpf  POCT urinalysis dipstick     Status: Abnormal   Collection Time: 01/28/18  3:04 PM  Result Value Ref Range   Color, UA yellow yellow   Clarity, UA clear clear   Glucose, UA negative negative mg/dL   Bilirubin, UA negative negative   Ketones, POC UA negative negative mg/dL   Spec Grav, UA 1.010 1.010 - 1.025   Blood, UA small (A) negative   pH, UA 6.5 5.0 - 8.0   Protein Ur, POC negative negative mg/dL   Urobilinogen, UA 0.2 0.2 or 1.0 E.U./dL   Nitrite, UA Negative Negative   Leukocytes, UA Small (1+) (A) Negative  POCT urine pregnancy     Status: None   Collection Time: 01/28/18  3:31 PM  Result Value Ref Range   Preg Test, Ur Negative Negative    Assessment and Plan :   Cystitis - Plan: POCT Microscopic Urinalysis ([facility]), POCT urinalysis dipstick, Urine Culture  Anxiety  Hematuria, unspecified type - Plan: GC/Chlamydia Probe Amp([facility]), Trichomonas vaginalis, RNA, POCT urine pregnancy  Will cover for cystitis with Macrobid, urine culture pending.  Counseled on signs of worsening urinary tract infection, patient is to return to clinic if these develop.  Counseled on withdrawal symptoms from weaning off of Pristiq.  Patient is to follow-up with her PCP or provided that is managing her discontinuation of Pristiq.  Jaynee Eagles, PA-C [facility] [phone] 01/28/2018 2:55 PM

## 2018-01-30 LAB — URINE CULTURE

## 2018-01-30 LAB — TRICHOMONAS VAGINALIS, PROBE AMP: Trich vag by NAA: NEGATIVE

## 2018-01-30 LAB — GC/CHLAMYDIA PROBE AMP
Chlamydia trachomatis, NAA: NEGATIVE
Neisseria gonorrhoeae by PCR: NEGATIVE

## 2018-01-31 ENCOUNTER — Encounter: Payer: Self-pay | Admitting: Physician Assistant

## 2018-02-01 ENCOUNTER — Encounter: Payer: Self-pay | Admitting: Physician Assistant

## 2018-02-02 ENCOUNTER — Telehealth: Payer: Self-pay | Admitting: Physician Assistant

## 2018-02-02 NOTE — Telephone Encounter (Signed)
Patient returning call for lab results. Please advise

## 2018-02-02 NOTE — Telephone Encounter (Signed)
Called and LVM for pt to call office back about labs.  

## 2018-02-02 NOTE — Telephone Encounter (Signed)
Copied from [place]. Topic: Inquiry >> Feb 02, 2018  1:40 PM Margot Ables wrote: Reason for CRM: pt calling to f/u on lab results from 01/28/18. Pt is having a lot of issues and waiting to hear back. Please call to advise.

## 2018-02-03 NOTE — Telephone Encounter (Signed)
Phone call to patient. She states she was able to view results on Mychart, she is feeling better from starting antibiotics. Patient has no further questions.

## 2018-02-11 ENCOUNTER — Ambulatory Visit (INDEPENDENT_AMBULATORY_CARE_PROVIDER_SITE_OTHER): Payer: BLUE CROSS/BLUE SHIELD | Admitting: Psychology

## 2018-02-11 DIAGNOSIS — F4323 Adjustment disorder with mixed anxiety and depressed mood: Secondary | ICD-10-CM | POA: Diagnosis not present

## 2018-02-25 ENCOUNTER — Ambulatory Visit (INDEPENDENT_AMBULATORY_CARE_PROVIDER_SITE_OTHER): Payer: BLUE CROSS/BLUE SHIELD | Admitting: Psychology

## 2018-02-25 DIAGNOSIS — F4323 Adjustment disorder with mixed anxiety and depressed mood: Secondary | ICD-10-CM

## 2018-03-11 ENCOUNTER — Ambulatory Visit: Payer: BLUE CROSS/BLUE SHIELD | Admitting: Psychology

## 2018-04-26 DIAGNOSIS — Z681 Body mass index (BMI) 19 or less, adult: Secondary | ICD-10-CM | POA: Insufficient documentation

## 2018-05-27 DIAGNOSIS — F411 Generalized anxiety disorder: Secondary | ICD-10-CM | POA: Diagnosis not present

## 2018-05-27 DIAGNOSIS — F419 Anxiety disorder, unspecified: Secondary | ICD-10-CM | POA: Diagnosis not present

## 2018-07-22 DIAGNOSIS — F419 Anxiety disorder, unspecified: Secondary | ICD-10-CM | POA: Diagnosis not present

## 2018-07-22 DIAGNOSIS — J45990 Exercise induced bronchospasm: Secondary | ICD-10-CM | POA: Diagnosis not present

## 2018-07-22 DIAGNOSIS — Z23 Encounter for immunization: Secondary | ICD-10-CM | POA: Diagnosis not present

## 2018-10-06 DIAGNOSIS — F411 Generalized anxiety disorder: Secondary | ICD-10-CM | POA: Diagnosis not present

## 2018-10-27 DIAGNOSIS — F411 Generalized anxiety disorder: Secondary | ICD-10-CM | POA: Diagnosis not present

## 2018-11-10 DIAGNOSIS — F411 Generalized anxiety disorder: Secondary | ICD-10-CM | POA: Diagnosis not present

## 2018-11-17 DIAGNOSIS — H6522 Chronic serous otitis media, left ear: Secondary | ICD-10-CM | POA: Diagnosis not present

## 2018-11-17 DIAGNOSIS — J3089 Other allergic rhinitis: Secondary | ICD-10-CM | POA: Diagnosis not present

## 2018-11-17 DIAGNOSIS — J3081 Allergic rhinitis due to animal (cat) (dog) hair and dander: Secondary | ICD-10-CM | POA: Diagnosis not present

## 2018-11-17 DIAGNOSIS — J301 Allergic rhinitis due to pollen: Secondary | ICD-10-CM | POA: Diagnosis not present

## 2018-11-24 DIAGNOSIS — F411 Generalized anxiety disorder: Secondary | ICD-10-CM | POA: Diagnosis not present

## 2018-12-08 DIAGNOSIS — F411 Generalized anxiety disorder: Secondary | ICD-10-CM | POA: Diagnosis not present

## 2018-12-29 DIAGNOSIS — F411 Generalized anxiety disorder: Secondary | ICD-10-CM | POA: Diagnosis not present

## 2019-02-16 DIAGNOSIS — R5383 Other fatigue: Secondary | ICD-10-CM | POA: Diagnosis not present

## 2019-02-25 DIAGNOSIS — Z3009 Encounter for other general counseling and advice on contraception: Secondary | ICD-10-CM | POA: Diagnosis not present

## 2019-02-28 DIAGNOSIS — Z3202 Encounter for pregnancy test, result negative: Secondary | ICD-10-CM | POA: Diagnosis not present

## 2019-02-28 DIAGNOSIS — Z118 Encounter for screening for other infectious and parasitic diseases: Secondary | ICD-10-CM | POA: Diagnosis not present

## 2019-02-28 DIAGNOSIS — Z30430 Encounter for insertion of intrauterine contraceptive device: Secondary | ICD-10-CM | POA: Diagnosis not present

## 2019-05-23 DIAGNOSIS — F411 Generalized anxiety disorder: Secondary | ICD-10-CM | POA: Diagnosis not present

## 2019-06-07 DIAGNOSIS — U071 COVID-19: Secondary | ICD-10-CM | POA: Diagnosis not present

## 2019-06-07 DIAGNOSIS — Z03818 Encounter for observation for suspected exposure to other biological agents ruled out: Secondary | ICD-10-CM | POA: Diagnosis not present

## 2019-07-07 DIAGNOSIS — F411 Generalized anxiety disorder: Secondary | ICD-10-CM | POA: Diagnosis not present

## 2019-07-11 DIAGNOSIS — Z118 Encounter for screening for other infectious and parasitic diseases: Secondary | ICD-10-CM | POA: Diagnosis not present

## 2019-07-11 DIAGNOSIS — Z113 Encounter for screening for infections with a predominantly sexual mode of transmission: Secondary | ICD-10-CM | POA: Diagnosis not present

## 2019-07-11 DIAGNOSIS — N898 Other specified noninflammatory disorders of vagina: Secondary | ICD-10-CM | POA: Diagnosis not present

## 2019-07-21 DIAGNOSIS — F411 Generalized anxiety disorder: Secondary | ICD-10-CM | POA: Diagnosis not present

## 2019-08-04 DIAGNOSIS — F411 Generalized anxiety disorder: Secondary | ICD-10-CM | POA: Diagnosis not present

## 2019-08-18 ENCOUNTER — Other Ambulatory Visit: Payer: Self-pay

## 2019-08-18 DIAGNOSIS — Z20822 Contact with and (suspected) exposure to covid-19: Secondary | ICD-10-CM

## 2019-08-19 LAB — NOVEL CORONAVIRUS, NAA: SARS-CoV-2, NAA: NOT DETECTED

## 2019-08-24 DIAGNOSIS — Z1329 Encounter for screening for other suspected endocrine disorder: Secondary | ICD-10-CM | POA: Diagnosis not present

## 2019-08-24 DIAGNOSIS — Z681 Body mass index (BMI) 19 or less, adult: Secondary | ICD-10-CM | POA: Diagnosis not present

## 2019-08-24 DIAGNOSIS — Z1159 Encounter for screening for other viral diseases: Secondary | ICD-10-CM | POA: Diagnosis not present

## 2019-08-24 DIAGNOSIS — Z114 Encounter for screening for human immunodeficiency virus [HIV]: Secondary | ICD-10-CM | POA: Diagnosis not present

## 2019-08-24 DIAGNOSIS — Z13220 Encounter for screening for lipoid disorders: Secondary | ICD-10-CM | POA: Diagnosis not present

## 2019-08-24 DIAGNOSIS — Z131 Encounter for screening for diabetes mellitus: Secondary | ICD-10-CM | POA: Diagnosis not present

## 2019-08-24 DIAGNOSIS — Z23 Encounter for immunization: Secondary | ICD-10-CM | POA: Diagnosis not present

## 2019-08-24 DIAGNOSIS — Z01419 Encounter for gynecological examination (general) (routine) without abnormal findings: Secondary | ICD-10-CM | POA: Diagnosis not present

## 2019-08-24 DIAGNOSIS — N760 Acute vaginitis: Secondary | ICD-10-CM | POA: Diagnosis not present

## 2019-08-29 ENCOUNTER — Other Ambulatory Visit: Payer: Self-pay

## 2019-08-29 DIAGNOSIS — Z20822 Contact with and (suspected) exposure to covid-19: Secondary | ICD-10-CM

## 2019-08-29 DIAGNOSIS — R05 Cough: Secondary | ICD-10-CM | POA: Diagnosis not present

## 2019-08-30 LAB — NOVEL CORONAVIRUS, NAA: SARS-CoV-2, NAA: NOT DETECTED

## 2019-09-12 DIAGNOSIS — F411 Generalized anxiety disorder: Secondary | ICD-10-CM | POA: Diagnosis not present

## 2019-09-28 DIAGNOSIS — F411 Generalized anxiety disorder: Secondary | ICD-10-CM | POA: Diagnosis not present

## 2019-10-12 ENCOUNTER — Ambulatory Visit: Payer: No Typology Code available for payment source | Attending: Internal Medicine

## 2019-10-12 DIAGNOSIS — Z20822 Contact with and (suspected) exposure to covid-19: Secondary | ICD-10-CM

## 2019-10-14 LAB — NOVEL CORONAVIRUS, NAA: SARS-CoV-2, NAA: NOT DETECTED

## 2019-12-06 ENCOUNTER — Ambulatory Visit: Payer: No Typology Code available for payment source | Attending: Internal Medicine

## 2019-12-06 DIAGNOSIS — Z20822 Contact with and (suspected) exposure to covid-19: Secondary | ICD-10-CM

## 2019-12-07 LAB — NOVEL CORONAVIRUS, NAA: SARS-CoV-2, NAA: NOT DETECTED

## 2019-12-08 DIAGNOSIS — J3089 Other allergic rhinitis: Secondary | ICD-10-CM | POA: Insufficient documentation

## 2021-02-22 LAB — HM PAP SMEAR: HM Pap smear: NEGATIVE

## 2021-03-25 DIAGNOSIS — D229 Melanocytic nevi, unspecified: Secondary | ICD-10-CM | POA: Insufficient documentation

## 2022-11-19 ENCOUNTER — Ambulatory Visit (INDEPENDENT_AMBULATORY_CARE_PROVIDER_SITE_OTHER): Payer: 59 | Admitting: Obstetrics and Gynecology

## 2022-11-19 ENCOUNTER — Encounter: Payer: Self-pay | Admitting: Obstetrics and Gynecology

## 2022-11-19 VITALS — BP 124/64 | HR 66 | Ht 68.75 in | Wt 126.8 lb

## 2022-11-19 DIAGNOSIS — Z01419 Encounter for gynecological examination (general) (routine) without abnormal findings: Secondary | ICD-10-CM

## 2022-11-19 DIAGNOSIS — Z3162 Encounter for fertility preservation counseling: Secondary | ICD-10-CM | POA: Diagnosis not present

## 2022-11-19 DIAGNOSIS — Z23 Encounter for immunization: Secondary | ICD-10-CM | POA: Diagnosis not present

## 2022-11-19 DIAGNOSIS — Z30431 Encounter for routine checking of intrauterine contraceptive device: Secondary | ICD-10-CM | POA: Diagnosis not present

## 2022-11-19 NOTE — Patient Instructions (Signed)

## 2022-11-19 NOTE — Progress Notes (Signed)
37 y.o. No obstetric history on file. Single White or Caucasian Not Hispanic or Latino female here for annual exam. She is about to end a 2 year relationship. No worries about std's.  She is wanting to talk about freezing eggs.  Period Cycle (Days): 28 Period Duration (Days): 7 Period Pattern: Regular Menstrual Flow: Light Menstrual Control: Tampon Menstrual Control Change Freq (Hours): 8 Dysmenorrhea: (!) Mild Dysmenorrhea Symptoms: Cramping  Patient's last menstrual period was 10/25/2022.          Sexually active: Yes.    The current method of family planning is IUD.   Has a paragaurd placed 03/2021 Exercising: Yes.     Walking, soccer, yoga.  Smoker:  no  Health Maintenance: Pap:  5/2/222 WNL Hr HPV Neg  History of abnormal Pap:  No  MMG:  none  BMD:   none  Colonoscopy: none  TDaP:  12/19/14 Gardasil: none    reports that she has never smoked. She has never used smokeless tobacco. She reports current alcohol use. She reports that she does not use drugs. She drinks 3-5 drinks a week. She is a Management consultant.   Past Medical History:  Diagnosis Date   Allergy    Anemia    Anxiety    Asthma     Past Surgical History:  Procedure Laterality Date   WISDOM TOOTH EXTRACTION      Current Outpatient Medications  Medication Sig Dispense Refill   albuterol (PROVENTIL HFA;VENTOLIN HFA) 108 (90 BASE) MCG/ACT inhaler Inhale 2 puffs into the lungs every 6 (six) hours as needed for wheezing or shortness of breath. 1 Inhaler 3   clonazePAM (KLONOPIN) 1 MG tablet Take 1 mg by mouth 2 (two) times daily as needed for anxiety.     EPINEPHrine (EPIPEN 2-PAK) 0.3 mg/0.3 mL IJ SOAJ injection Inject 0.3 mLs (0.3 mg total) into the muscle once as needed (for severe allergic reaction). CAll 911 immediately if you have to use this medicine 1 Device 1   sertraline (ZOLOFT) 100 MG tablet Take 100 mg by mouth daily.     No current facility-administered medications for this visit.    Family  History  Problem Relation Age of Onset   Prostate cancer Father    Breast cancer Maternal Grandmother    Emphysema Maternal Grandfather    Breast cancer Paternal Grandmother     Review of Systems  All other systems reviewed and are negative.   Exam:   BP 124/64   Pulse 66   Ht 5' 8.75" (1.746 m)   Wt 126 lb 12.8 oz (57.5 kg)   LMP 10/25/2022   SpO2 100%   BMI 18.86 kg/m   Weight change: '@WEIGHTCHANGE'$ @ Height:   Height: 5' 8.75" (174.6 cm)  Ht Readings from Last 3 Encounters:  11/19/22 5' 8.75" (1.746 m)  01/28/18 5' 8.5" (1.74 m)  05/15/17 '5\' 8"'$  (1.727 m)    General appearance: alert, cooperative and appears stated age Head: Normocephalic, without obvious abnormality, atraumatic Neck: no adenopathy, supple, symmetrical, trachea midline and thyroid normal to inspection and palpation Lungs: clear to auscultation bilaterally Cardiovascular: regular rate and rhythm Breasts: normal appearance, no masses or tenderness Abdomen: soft, non-tender; non distended,  no masses,  no organomegaly Extremities: extremities normal, atraumatic, no cyanosis or edema Skin: Skin color, texture, turgor normal. No rashes or lesions Lymph nodes: Cervical, supraclavicular, and axillary nodes normal. No abnormal inguinal nodes palpated Neurologic: Grossly normal   Pelvic: External genitalia:  no lesions  Urethra:  normal appearing urethra with no masses, tenderness or lesions              Bartholins and Skenes: normal                 Vagina: normal appearing vagina with normal color and discharge, no lesions              Cervix: no lesions, IUD strings 3-4 cm               Bimanual Exam:  Uterus:  normal size, contour, position, consistency, mobility, non-tender              Adnexa: no mass, fullness, tenderness               Rectovaginal: Confirms               Anus:  normal sphincter tone, no lesions  Gae Dry, CMA chaperoned for the exam.  1. Well woman exam Discussed  breast self exam Discussed calcium and vit D intake Pap is UTD  2. IUD check up Doing well  3. Need for HPV vaccination Discussed, will start the series - HPV 9-valent vaccine,Recombinat  4. Encounter for fertility preservation counseling She has questions about freezing her eggs. Given the # for the infertility clinic for counseling.

## 2022-12-17 ENCOUNTER — Ambulatory Visit (INDEPENDENT_AMBULATORY_CARE_PROVIDER_SITE_OTHER): Payer: 59

## 2022-12-17 DIAGNOSIS — Z23 Encounter for immunization: Secondary | ICD-10-CM

## 2023-01-07 ENCOUNTER — Ambulatory Visit (INDEPENDENT_AMBULATORY_CARE_PROVIDER_SITE_OTHER): Payer: 59 | Admitting: Family Medicine

## 2023-01-07 ENCOUNTER — Encounter: Payer: Self-pay | Admitting: Family Medicine

## 2023-01-07 VITALS — BP 100/64 | HR 70 | Temp 97.6°F | Ht 68.75 in | Wt 127.0 lb

## 2023-01-07 DIAGNOSIS — J3089 Other allergic rhinitis: Secondary | ICD-10-CM

## 2023-01-07 DIAGNOSIS — Z7689 Persons encountering health services in other specified circumstances: Secondary | ICD-10-CM

## 2023-01-07 DIAGNOSIS — F429 Obsessive-compulsive disorder, unspecified: Secondary | ICD-10-CM | POA: Diagnosis not present

## 2023-01-07 DIAGNOSIS — F419 Anxiety disorder, unspecified: Secondary | ICD-10-CM | POA: Diagnosis not present

## 2023-01-07 DIAGNOSIS — Z862 Personal history of diseases of the blood and blood-forming organs and certain disorders involving the immune mechanism: Secondary | ICD-10-CM | POA: Diagnosis not present

## 2023-01-07 LAB — COMPREHENSIVE METABOLIC PANEL
ALT: 11 U/L (ref 0–35)
AST: 18 U/L (ref 0–37)
Albumin: 4.4 g/dL (ref 3.5–5.2)
Alkaline Phosphatase: 42 U/L (ref 39–117)
BUN: 7 mg/dL (ref 6–23)
CO2: 23 mEq/L (ref 19–32)
Calcium: 9.7 mg/dL (ref 8.4–10.5)
Chloride: 105 mEq/L (ref 96–112)
Creatinine, Ser: 0.79 mg/dL (ref 0.40–1.20)
GFR: 96.48 mL/min (ref >60.00)
Glucose, Bld: 65 mg/dL — ABNORMAL LOW (ref 70–99)
Potassium: 4 mEq/L (ref 3.5–5.1)
Sodium: 141 mEq/L (ref 135–145)
Total Bilirubin: 0.4 mg/dL (ref 0.2–1.2)
Total Protein: 7.4 g/dL (ref 6.0–8.3)

## 2023-01-07 LAB — TSH: TSH: 1.65 u[IU]/mL (ref 0.35–5.50)

## 2023-01-07 LAB — CBC WITH DIFFERENTIAL/PLATELET
Basophils Absolute: 0 10*3/uL (ref 0.0–0.1)
Basophils Relative: 0.7 % (ref 0.0–3.0)
Eosinophils Absolute: 0.2 10*3/uL (ref 0.0–0.7)
Eosinophils Relative: 3.8 % (ref 0.0–5.0)
HCT: 39.9 % (ref 36.0–46.0)
Hemoglobin: 13.5 g/dL (ref 12.0–15.0)
Lymphocytes Relative: 40.3 % (ref 12.0–46.0)
Lymphs Abs: 2 10*3/uL (ref 0.7–4.0)
MCHC: 33.9 g/dL (ref 30.0–36.0)
MCV: 88.5 fl (ref 78.0–100.0)
Monocytes Absolute: 0.3 10*3/uL (ref 0.1–1.0)
Monocytes Relative: 6.4 % (ref 3.0–12.0)
Neutro Abs: 2.4 10*3/uL (ref 1.4–7.7)
Neutrophils Relative %: 48.8 % (ref 43.0–77.0)
Platelets: 231 10*3/uL (ref 150.0–400.0)
RBC: 4.51 Mil/uL (ref 3.87–5.11)
RDW: 13.6 % (ref 11.5–15.5)
WBC: 5 10*3/uL (ref 4.0–10.5)

## 2023-01-07 LAB — T4, FREE: Free T4: 0.77 ng/dL (ref 0.60–1.60)

## 2023-01-07 MED ORDER — SERTRALINE HCL 50 MG PO TABS: 50.0000 mg | ORAL_TABLET | Freq: Every day | ORAL | 1 refills | Status: DC

## 2023-01-07 MED ORDER — SERTRALINE HCL 100 MG PO TABS: 100.0000 mg | ORAL_TABLET | Freq: Every day | ORAL | 1 refills | Status: DC

## 2023-01-07 NOTE — Progress Notes (Signed)
New Patient Office Visit  Subjective    Patient ID: Kristen Lane, female    DOB: Nov 05, 1985  Age: 37 y.o. MRN: [id number]  CC:  Chief Complaint  Patient presents with   Establish Care    Would like to discuss increasing zoloft, anxiety is worsening    HPI Kristen Lane presents to establish care [facility] in past.   OB/GYN- Dr. Talbert Nan  Therapist - Charna Archer   Anxiety and OCD tendencies   She has been taking Zoloft 100 mg for the past 5 years. States she thinks her dose needs to be increased.   Currently going through a breakup which has triggered worsening anxiety.  Divorce in 2019   Previous medications tried: Pristiq, Effexor , Prozac      01/07/2023   10:38 AM 01/28/2018    2:55 PM 05/15/2017    4:17 PM 12/12/2015    2:08 PM 02/14/2015   10:14 AM  Depression screen PHQ 2/9  Decreased Interest 1 0 1 0 0  Down, Depressed, Hopeless 1 0 0 0 0  PHQ - 2 Score 2 0 1 0 0  Altered sleeping 0      Tired, decreased energy 1      Change in appetite 0      Feeling bad or failure about yourself  0      Trouble concentrating 0      Moving slowly or fidgety/restless 0      Suicidal thoughts 0      PHQ-9 Score 3      Difficult doing work/chores Not difficult at all           Outpatient Encounter Medications as of 01/07/2023  Medication Sig   albuterol (PROVENTIL HFA;VENTOLIN HFA) 108 (90 BASE) MCG/ACT inhaler Inhale 2 puffs into the lungs every 6 (six) hours as needed for wheezing or shortness of breath.   clonazePAM (KLONOPIN) 1 MG tablet Take 1 mg by mouth 2 (two) times daily as needed for anxiety.   Fexofenadine HCl (ALLERGY 24-HR PO) Take by mouth.   Prenatal Vit-Fe Fumarate-FA (PRENATAL VITAMIN PO) Take by mouth.   sertraline (ZOLOFT) 50 MG tablet Take 1 tablet (50 mg total) by mouth daily.   [DISCONTINUED] sertraline (ZOLOFT) 100 MG tablet Take 100 mg by mouth daily.   EPINEPHrine (EPIPEN 2-PAK) 0.3 mg/0.3 mL IJ SOAJ injection Inject 0.3 mLs (0.3 mg total)  into the muscle once as needed (for severe allergic reaction). CAll 911 immediately if you have to use this medicine (Patient not taking: Reported on 01/07/2023)   sertraline (ZOLOFT) 100 MG tablet Take 1 tablet (100 mg total) by mouth daily.   No facility-administered encounter medications on file as of 01/07/2023.    Past Medical History:  Diagnosis Date   Allergy    Anemia    Anxiety    Asthma     Past Surgical History:  Procedure Laterality Date   WISDOM TOOTH EXTRACTION      Family History  Problem Relation Age of Onset   Prostate cancer Father    Breast cancer Maternal Grandmother    Cancer Maternal Grandmother    Emphysema Maternal Grandfather    Breast cancer Paternal Grandmother    Cancer Paternal Grandmother     Social History   Socioeconomic History   Marital status: Single    Spouse name: Kristen Lane   Number of children: 0   Years of education: Master's Degree   Highest education level: Not on file  Occupational History  Occupation: Water quality scientist: [employer]  Tobacco Use   Smoking status: Never   Smokeless tobacco: Never  Vaping Use   Vaping Use: Never used  Substance and Sexual Activity   Alcohol use: Yes    Alcohol/week: 3.0 standard drinks of alcohol    Types: 1 Glasses of wine, 1 Cans of beer, 1 Shots of liquor per week    Comment: 3 in last month   Drug use: No   Sexual activity: Yes    Birth control/protection: I.U.D.    Comment: nuvaring  Other Topics Concern   Not on file  Social History Narrative   Lives with her husband (married 04/18/2017).   Parents live nearby.   Sister lives in [place].   Social Determinants of Health   Financial Resource Strain: Not on file  Food Insecurity: Not on file  Transportation Needs: Not on file  Physical Activity: Not on file  Stress: Not on file  Social Connections: Not on file  Intimate Partner Violence: Not on file    Review of Systems   Constitutional:  Negative for chills, fever, malaise/fatigue and weight loss.  Respiratory:  Negative for shortness of breath.   Cardiovascular:  Negative for chest pain, palpitations and leg swelling.  Gastrointestinal:  Negative for abdominal pain, constipation, diarrhea, nausea and vomiting.  Genitourinary:  Negative for dysuria, frequency and urgency.  Neurological:  Negative for dizziness, weakness and headaches.  Psychiatric/Behavioral:  Negative for depression and suicidal ideas.         Objective    BP 100/64 (BP Location: Left Arm, Patient Position: Sitting, Cuff Size: Large)   Pulse 70   Temp 97.6 F (36.4 C) (Temporal)   Ht 5' 8.75" (1.746 m)   Wt 127 lb (57.6 kg)   LMP 12/16/2022   SpO2 99%   BMI 18.89 kg/m   Physical Exam Constitutional:      General: She is not in acute distress.    Appearance: She is not ill-appearing.  Eyes:     Extraocular Movements: Extraocular movements intact.     Conjunctiva/sclera: Conjunctivae normal.  Neck:     Thyroid: No thyroid mass, thyromegaly or thyroid tenderness.  Cardiovascular:     Rate and Rhythm: Normal rate.  Pulmonary:     Effort: Pulmonary effort is normal.  Musculoskeletal:     Cervical back: Normal range of motion and neck supple.  Skin:    General: Skin is warm and dry.  Neurological:     General: No focal deficit present.     Mental Status: She is alert and oriented to person, place, and time.  Psychiatric:        Mood and Affect: Mood normal.        Behavior: Behavior normal.        Thought Content: Thought content normal.         Assessment & Plan:   Problem List Items Addressed This Visit       Other   Anxiety - Primary   Relevant Medications   sertraline (ZOLOFT) 50 MG tablet   sertraline (ZOLOFT) 100 MG tablet   Other Relevant Orders   CBC with Differential/Platelet (Completed)   Comprehensive metabolic panel (Completed)   TSH (Completed)   T4, free (Completed)   Environmental and  seasonal allergies   Other Visit Diagnoses     Encounter to establish care       Obsessive-compulsive disorder, unspecified type  Relevant Medications   sertraline (ZOLOFT) 50 MG tablet   sertraline (ZOLOFT) 100 MG tablet   Other Relevant Orders   TSH (Completed)   T4, free (Completed)   History of anemia       Relevant Orders   CBC with Differential/Platelet (Completed)   Comprehensive metabolic panel (Completed)      New to the practice.  Increase Zoloft to 150 mg. Follow up in 4 weeks  Check labs to look for underlying etiology for worsening anxiety and for hx of anemia.   Return in about 4 weeks (around 02/04/2023) for medication change .   Harland Dingwall, NP-C

## 2023-01-07 NOTE — Patient Instructions (Signed)
Please note I have increased your Zoloft to 150 mg.  This requires 100 mg tablet and a 50 mg tablet.  Follow-up in 4 weeks or sooner if needed.

## 2023-02-04 ENCOUNTER — Encounter: Payer: Self-pay | Admitting: Family Medicine

## 2023-02-04 ENCOUNTER — Ambulatory Visit (INDEPENDENT_AMBULATORY_CARE_PROVIDER_SITE_OTHER): Payer: PRIVATE HEALTH INSURANCE | Admitting: Family Medicine

## 2023-02-04 VITALS — BP 102/64 | HR 67 | Temp 97.8°F | Ht 68.75 in | Wt 124.0 lb

## 2023-02-04 DIAGNOSIS — J3089 Other allergic rhinitis: Secondary | ICD-10-CM

## 2023-02-04 DIAGNOSIS — F419 Anxiety disorder, unspecified: Secondary | ICD-10-CM | POA: Diagnosis not present

## 2023-02-04 DIAGNOSIS — R0789 Other chest pain: Secondary | ICD-10-CM | POA: Diagnosis not present

## 2023-02-04 NOTE — Progress Notes (Signed)
Subjective:     Patient ID: Kristen Lane, female    DOB: 05-Mar-1986, 37 y.o.   MRN: [id number]  Chief Complaint  Patient presents with   Anxiety    Feels maybe some slight improvement but has also had a lot of big transitions during that time    HPI Patient is in today for 4 wk follow up on anxiety and increasing sertraline to 150 mg daily. This is her 2nd visit with me.  She has a therapist.   Her partner moved out which has been stressful.   Going to [place] 2 times per week.   Takes clonazepam infrequently.   States her chest feels tight. She took a clonazepam and went to sleep.      Health Maintenance Due  Topic Date Due   HIV Screening  Never done   Hepatitis C Screening  Never done   COVID-19 Vaccine (4 - 2023-24 season) 06/20/2022   HPV VACCINES (3 - 3-dose SCDM series) 05/20/2023    Past Medical History:  Diagnosis Date   Allergy    Anemia    Anxiety    Asthma     Past Surgical History:  Procedure Laterality Date   WISDOM TOOTH EXTRACTION      Family History  Problem Relation Age of Onset   Prostate cancer Father    Breast cancer Maternal Grandmother    Cancer Maternal Grandmother    Emphysema Maternal Grandfather    Breast cancer Paternal Grandmother    Cancer Paternal Grandmother     Social History   Socioeconomic History   Marital status: Single    Spouse name: Lonna Duval   Number of children: 0   Years of education: Master's Degree   Highest education level: Master's degree (e.g., MA, MS, MEng, MEd, MSW, MBA)  Occupational History   Occupation: Orthoptist: [employer]  Tobacco Use   Smoking status: Never   Smokeless tobacco: Never  Vaping Use   Vaping Use: Never used  Substance and Sexual Activity   Alcohol use: Yes    Alcohol/week: 3.0 standard drinks of alcohol    Types: 1 Glasses of wine, 1 Cans of beer, 1 Shots of liquor per week    Comment: 3 in last month   Drug use: No    Sexual activity: Yes    Birth control/protection: I.U.D.    Comment: nuvaring  Other Topics Concern   Not on file  Social History Narrative   Lives with her husband (married 04/18/2017).   Parents live nearby.   Sister lives in [place].   Social Determinants of Health   Financial Resource Strain: Low Risk  (01/31/2023)   Overall Financial Resource Strain (CARDIA)    Difficulty of Paying Living Expenses: Not hard at all  Food Insecurity: No Food Insecurity (01/31/2023)   Hunger Vital Sign    Worried About Running Out of Food in the Last Year: Never true    Ran Out of Food in the Last Year: Never true  Transportation Needs: No Transportation Needs (01/31/2023)   PRAPARE - Administrator, Civil Service (Medical): No    Lack of Transportation (Non-Medical): No  Physical Activity: Insufficiently Active (01/31/2023)   Exercise Vital Sign    Days of Exercise per Week: 3 days    Minutes of Exercise per Session: 40 min  Stress: Stress Concern Present (01/31/2023)   [employer] of Occupational Health - Occupational Stress Questionnaire  Feeling of Stress : Rather much  Social Connections: Moderately Integrated (01/31/2023)   Social Connection and Isolation Panel [NHANES]    Frequency of Communication with Friends and Family: More than three times a week    Frequency of Social Gatherings with Friends and Family: Three times a week    Attends Religious Services: 1 to 4 times per year    Active Member of Clubs or Organizations: Yes    Attends Engineer, structural: More than 4 times per year    Marital Status: Divorced  Catering manager Violence: Not on file    Outpatient Medications Prior to Visit  Medication Sig Dispense Refill   albuterol (PROVENTIL HFA;VENTOLIN HFA) 108 (90 BASE) MCG/ACT inhaler Inhale 2 puffs into the lungs every 6 (six) hours as needed for wheezing or shortness of breath. 1 Inhaler 3   clonazePAM (KLONOPIN) 1 MG tablet Take 1 mg by mouth 2  (two) times daily as needed for anxiety.     EPINEPHrine (EPIPEN 2-PAK) 0.3 mg/0.3 mL IJ SOAJ injection Inject 0.3 mLs (0.3 mg total) into the muscle once as needed (for severe allergic reaction). CAll 911 immediately if you have to use this medicine 1 Device 1   Fexofenadine HCl (ALLERGY 24-HR PO) Take by mouth.     Prenatal Vit-Fe Fumarate-FA (PRENATAL VITAMIN PO) Take by mouth.     sertraline (ZOLOFT) 100 MG tablet Take 1 tablet (100 mg total) by mouth daily. 30 tablet 1   sertraline (ZOLOFT) 50 MG tablet Take 1 tablet (50 mg total) by mouth daily. 30 tablet 1   No facility-administered medications prior to visit.    Allergies  Allergen Reactions   Bee Venom Hives and Nausea And Vomiting    Light headed LOC   Molds & Smuts Itching    Review of Systems  Constitutional:  Negative for chills and fever.  Respiratory:  Negative for shortness of breath.   Cardiovascular:  Positive for chest pain. Negative for palpitations and leg swelling.  Gastrointestinal:  Negative for abdominal pain, constipation, diarrhea, nausea and vomiting.  Genitourinary:  Negative for dysuria, frequency and urgency.  Neurological:  Negative for dizziness and focal weakness.  Psychiatric/Behavioral:  The patient is nervous/anxious.        Objective:    Physical Exam Constitutional:      General: She is not in acute distress.    Appearance: She is not ill-appearing.  Eyes:     Extraocular Movements: Extraocular movements intact.     Conjunctiva/sclera: Conjunctivae normal.  Cardiovascular:     Rate and Rhythm: Normal rate and regular rhythm.     Heart sounds: Normal heart sounds.  Pulmonary:     Effort: Pulmonary effort is normal.     Breath sounds: Normal breath sounds.  Musculoskeletal:     Cervical back: Normal range of motion and neck supple.     Right lower leg: No edema.     Left lower leg: No edema.  Skin:    General: Skin is warm and dry.  Neurological:     General: No focal deficit  present.     Mental Status: She is alert and oriented to person, place, and time.     Cranial Nerves: No cranial nerve deficit.     Motor: No weakness.  Psychiatric:        Mood and Affect: Mood normal.        Behavior: Behavior normal.        Thought Content: Thought content normal.  BP 102/64 (BP Location: Left Arm, Patient Position: Sitting, Cuff Size: Normal)   Pulse 67   Temp 97.8 F (36.6 C) (Temporal)   Ht 5' 8.75" (1.746 m)   Wt 124 lb (56.2 kg)   LMP 12/16/2022   SpO2 98%   BMI 18.45 kg/m  Wt Readings from Last 3 Encounters:  02/04/23 124 lb (56.2 kg)  01/07/23 127 lb (57.6 kg)  11/19/22 126 lb 12.8 oz (57.5 kg)       Assessment & Plan:   Problem List Items Addressed This Visit       Other   Anxiety - Primary   Environmental and seasonal allergies   Other Visit Diagnoses     Feeling of chest tightness          Here for follow-up on anxiety.  We increased her sertraline dose to 150 mg at her last visit.  She has noticed improvement but has had a lot of stress since her last visit.  She will schedule with her therapist.  Chest tightness appears to be related to anxiety since it was relieved with clonazepam.  She will follow-up if she notices any worsening or new symptoms.   I am having Kristen Lane maintain her albuterol, EPINEPHrine, clonazePAM, Prenatal Vit-Fe Fumarate-FA (PRENATAL VITAMIN PO), Fexofenadine HCl (ALLERGY 24-HR PO), sertraline, and sertraline.  No orders of the defined types were placed in this encounter.

## 2023-02-04 NOTE — Patient Instructions (Signed)
Continue your current medications.   Follow up if you notice any new or worsening symptoms.

## 2023-02-18 ENCOUNTER — Ambulatory Visit: Payer: 59

## 2023-02-27 ENCOUNTER — Other Ambulatory Visit: Payer: Self-pay | Admitting: Family Medicine

## 2023-02-27 ENCOUNTER — Encounter: Payer: Self-pay | Admitting: Family Medicine

## 2023-02-27 DIAGNOSIS — F429 Obsessive-compulsive disorder, unspecified: Secondary | ICD-10-CM

## 2023-02-27 DIAGNOSIS — F419 Anxiety disorder, unspecified: Secondary | ICD-10-CM

## 2023-02-27 MED ORDER — SERTRALINE HCL 100 MG PO TABS: 100.0000 mg | ORAL_TABLET | Freq: Every day | ORAL | 1 refills | Status: DC

## 2023-02-27 NOTE — Telephone Encounter (Signed)
Pt sent mychart message regarding refill, f/u on how she has been doing on the new 150 mg and states "I wanted to let you know that in the past 2-3 weeks, I have noticed an obvious benefit from the increase, so things are going really well on 150mg . Thanks so much!"  Last fill: 01/07/23, 30 tablets 1 refill LOV: 02/04/23

## 2023-04-27 ENCOUNTER — Ambulatory Visit (INDEPENDENT_AMBULATORY_CARE_PROVIDER_SITE_OTHER): Payer: 59

## 2023-04-27 DIAGNOSIS — Z23 Encounter for immunization: Secondary | ICD-10-CM

## 2023-05-01 ENCOUNTER — Other Ambulatory Visit: Payer: Self-pay | Admitting: Family Medicine

## 2023-05-01 DIAGNOSIS — F419 Anxiety disorder, unspecified: Secondary | ICD-10-CM

## 2023-05-01 DIAGNOSIS — F429 Obsessive-compulsive disorder, unspecified: Secondary | ICD-10-CM

## 2023-05-04 NOTE — Telephone Encounter (Signed)
LOV: 02/04/23 Last fill: 02/27/23, 30 tablets 1 refill

## 2023-05-06 ENCOUNTER — Ambulatory Visit: Payer: 59 | Admitting: Family Medicine

## 2023-05-06 ENCOUNTER — Encounter: Payer: Self-pay | Admitting: Family Medicine

## 2023-05-06 VITALS — BP 98/66 | HR 70 | Temp 97.6°F | Ht 68.75 in | Wt 124.0 lb

## 2023-05-06 DIAGNOSIS — F419 Anxiety disorder, unspecified: Secondary | ICD-10-CM

## 2023-05-06 DIAGNOSIS — Z8349 Family history of other endocrine, nutritional and metabolic diseases: Secondary | ICD-10-CM | POA: Diagnosis not present

## 2023-05-06 NOTE — Progress Notes (Signed)
Subjective:     Patient ID: Kristen Lane, female    DOB: [birth year], 37 y.o.   MRN: [id number]  Chief Complaint  Patient presents with   Anxiety    3 month f/u    Anxiety Patient reports no chest pain, dizziness, nausea, nervous/anxious behavior, palpitations or shortness of breath.      Discussed the use of AI scribe software for clinical note transcription with the patient, who gave verbal consent to proceed.  History of Present Illness          She is here for a 57-month follow-up on anxiety.  Reports doing well.  Taking sertraline 150 mg daily.  She rarely takes clonazepam.  She is seeing her therapist with [facility].  Reports feeling well  States her sister was recently diagnosed with hyperparathyroidism and she wants to make sure her calcium level was checked with her last labs.      Health Maintenance Due  Topic Date Due   HIV Screening  Never done   Hepatitis C Screening  Never done   COVID-19 Vaccine (4 - 2023-24 season) 06/20/2022    Past Medical History:  Diagnosis Date   Allergy    Anemia    Anxiety    Asthma     Past Surgical History:  Procedure Laterality Date   WISDOM TOOTH EXTRACTION      Family History  Problem Relation Age of Onset   Prostate cancer Father    Breast cancer Maternal Grandmother    Cancer Maternal Grandmother    Emphysema Maternal Grandfather    Breast cancer Paternal Grandmother    Cancer Paternal Grandmother     Social History   Socioeconomic History   Marital status: Single    Spouse name: Lonna Duval   Number of children: 0   Years of education: Master's Degree   Highest education level: Master's degree (e.g., MA, MS, MEng, MEd, MSW, MBA)  Occupational History   Occupation: Orthoptist: [employer]  Tobacco Use   Smoking status: Never   Smokeless tobacco: Never  Vaping Use   Vaping status: Never Used  Substance and Sexual Activity    Alcohol use: Yes    Alcohol/week: 3.0 standard drinks of alcohol    Types: 1 Glasses of wine, 1 Cans of beer, 1 Shots of liquor per week    Comment: 3 in last month   Drug use: No   Sexual activity: Yes    Birth control/protection: I.U.D.    Comment: nuvaring  Other Topics Concern   Not on file  Social History Narrative   Lives with her husband (married 04/18/2017).   Parents live nearby.   Sister lives in [place].   Social Determinants of Health   Financial Resource Strain: Low Risk  (01/31/2023)   Overall Financial Resource Strain (CARDIA)    Difficulty of Paying Living Expenses: Not hard at all  Food Insecurity: No Food Insecurity (01/31/2023)   Hunger Vital Sign    Worried About Running Out of Food in the Last Year: Never true    Ran Out of Food in the Last Year: Never true  Transportation Needs: No Transportation Needs (01/31/2023)   PRAPARE - Administrator, Civil Service (Medical): No    Lack of Transportation (Non-Medical): No  Physical Activity: Insufficiently Active (01/31/2023)   Exercise Vital Sign    Days of Exercise per Week: 3 days    Minutes of Exercise per Session:  40 min  Stress: Stress Concern Present (01/31/2023)   [employer] of Occupational Health - Occupational Stress Questionnaire    Feeling of Stress : Rather much  Social Connections: Unknown (04/29/2023)   [facility] from [facility]   Social Network    Social Network: Not on file  Intimate Partner Violence: Unknown (04/29/2023)   Received from [facility]   HITS    Physically Hurt: Not on file    Insult or Talk Down To: Not on file    Threaten Physical Harm: Not on file    Scream or Curse: Not on file    Outpatient Medications Prior to Visit  Medication Sig Dispense Refill   albuterol (PROVENTIL HFA;VENTOLIN HFA) 108 (90 BASE) MCG/ACT inhaler Inhale 2 puffs into the lungs every 6 (six) hours as needed for wheezing or shortness of breath. 1 Inhaler 3   clonazePAM (KLONOPIN) 1 MG  tablet Take 1 mg by mouth 2 (two) times daily as needed for anxiety.     EPINEPHrine (EPIPEN 2-PAK) 0.3 mg/0.3 mL IJ SOAJ injection Inject 0.3 mLs (0.3 mg total) into the muscle once as needed (for severe allergic reaction). CAll 911 immediately if you have to use this medicine 1 Device 1   Fexofenadine HCl (ALLERGY 24-HR PO) Take by mouth.     Prenatal Vit-Fe Fumarate-FA (PRENATAL VITAMIN PO) Take by mouth.     sertraline (ZOLOFT) 100 MG tablet Take 1 tablet (100 mg total) by mouth daily. 30 tablet 1   sertraline (ZOLOFT) 50 MG tablet Take 1 tablet (50 mg total) by mouth daily. 30 tablet 1   No facility-administered medications prior to visit.    Allergies  Allergen Reactions   Bee Venom Hives and Nausea And Vomiting    Light headed LOC   Molds & Smuts Itching    Review of Systems  Constitutional:  Negative for chills and fever.  Respiratory:  Negative for shortness of breath.   Cardiovascular:  Negative for chest pain, palpitations and leg swelling.  Gastrointestinal:  Negative for abdominal pain, constipation, diarrhea, nausea and vomiting.  Genitourinary:  Negative for dysuria, frequency and urgency.  Neurological:  Negative for dizziness.  Psychiatric/Behavioral:  Negative for depression. The patient is not nervous/anxious.        Objective:    Physical Exam Constitutional:      General: She is not in acute distress.    Appearance: She is not ill-appearing.  Eyes:     Extraocular Movements: Extraocular movements intact.     Conjunctiva/sclera: Conjunctivae normal.  Cardiovascular:     Rate and Rhythm: Normal rate.  Pulmonary:     Effort: Pulmonary effort is normal.  Musculoskeletal:     Cervical back: Normal range of motion.  Skin:    General: Skin is warm and dry.  Neurological:     General: No focal deficit present.     Mental Status: She is alert and oriented to person, place, and time.  Psychiatric:        Mood and Affect: Mood normal.        Behavior:  Behavior normal.        Thought Content: Thought content normal.      BP 98/66 (BP Location: Left Arm, Patient Position: Sitting, Cuff Size: Normal)   Pulse 70   Temp 97.6 F (36.4 C) (Temporal)   Ht 5' 8.75" (1.746 m)   Wt 124 lb (56.2 kg)   SpO2 100%   BMI 18.45 kg/m  Wt Readings from Last  3 Encounters:  05/06/23 124 lb (56.2 kg)  02/04/23 124 lb (56.2 kg)  01/07/23 127 lb (57.6 kg)       Assessment & Plan:   Problem List Items Addressed This Visit       Other   Anxiety - Primary   Family history of hyperparathyroidism   Continue sertraline and counseling.  She is doing well. Reviewed labs from March showing a normal calcium, thyroid and CBC. Follow-up in 6 months and we can do a fasting CPE at that time.  She does see an OB/GYN.  I am having Kristen Lane maintain her albuterol, EPINEPHrine, clonazePAM, Prenatal Vit-Fe Fumarate-FA (PRENATAL VITAMIN PO), Fexofenadine HCl (ALLERGY 24-HR PO), sertraline, and sertraline.  No orders of the defined types were placed in this encounter.

## 2023-05-06 NOTE — Patient Instructions (Signed)
  Component Ref Range & Units 3 mo ago 8 yr ago 10 yr ago  Sodium 135 - 145 mEq/L 141 140 140  Potassium 3.5 - 5.1 mEq/L 4.0 5.2 R 4.1 R  Chloride 96 - 112 mEq/L 105 101 104  CO2 19 - 32 mEq/L 23 24 29   Glucose, Bld 70 - 99 mg/dL 65 Low  87 87  BUN 6 - 23 mg/dL 7 8 10   Creatinine, Ser 0.40 - 1.20 mg/dL 1.61 0.96 R 0.45 R  Total Bilirubin 0.2 - 1.2 mg/dL 0.4 0.5 0.5 R  Alkaline Phosphatase 39 - 117 U/L 42 42 43  AST 0 - 37 U/L 18 19 20   ALT 0 - 35 U/L 11 17 14   Total Protein 6.0 - 8.3 g/dL 7.4 6.6 7.3  Albumin 3.5 - 5.2 g/dL 4.4 4.2 4.3  GFR >40.98 mL/min 96.48    Comment: Calculated using the CKD-EPI Creatinine Equation (2021)  Calcium 8.4 - 10.5 mg/dL 9.7 9.4

## 2023-06-17 ENCOUNTER — Other Ambulatory Visit: Payer: Self-pay | Admitting: Family Medicine

## 2023-06-17 ENCOUNTER — Encounter: Payer: Self-pay | Admitting: Family Medicine

## 2023-06-17 DIAGNOSIS — F419 Anxiety disorder, unspecified: Secondary | ICD-10-CM

## 2023-06-17 MED ORDER — CLONAZEPAM 1 MG PO TABS
1.0000 mg | ORAL_TABLET | Freq: Every day | ORAL | 0 refills | Status: DC | PRN
Start: 2023-06-17 — End: 2024-04-03

## 2023-06-17 NOTE — Telephone Encounter (Signed)
Looks like it was never sent to [place], maybe previous PCP? Are you ok filling this?

## 2023-07-02 ENCOUNTER — Other Ambulatory Visit: Payer: Self-pay | Admitting: Family Medicine

## 2023-07-02 DIAGNOSIS — F419 Anxiety disorder, unspecified: Secondary | ICD-10-CM

## 2023-07-02 DIAGNOSIS — F429 Obsessive-compulsive disorder, unspecified: Secondary | ICD-10-CM

## 2023-07-02 NOTE — Telephone Encounter (Signed)
LOV: 05/06/23 Last fill: 02/27/23, 30 tablets 1 refill

## 2023-07-06 ENCOUNTER — Other Ambulatory Visit: Payer: Self-pay | Admitting: Family Medicine

## 2023-07-06 DIAGNOSIS — F419 Anxiety disorder, unspecified: Secondary | ICD-10-CM

## 2023-07-06 DIAGNOSIS — F429 Obsessive-compulsive disorder, unspecified: Secondary | ICD-10-CM

## 2023-07-06 NOTE — Telephone Encounter (Signed)
LOV: 05/06/23 Last fill: 05/04/23, 30 tablets 1 refill

## 2023-08-12 ENCOUNTER — Ambulatory Visit: Payer: 59 | Admitting: Obstetrics and Gynecology

## 2023-09-01 ENCOUNTER — Other Ambulatory Visit: Payer: Self-pay | Admitting: Family Medicine

## 2023-09-01 DIAGNOSIS — F429 Obsessive-compulsive disorder, unspecified: Secondary | ICD-10-CM

## 2023-09-01 DIAGNOSIS — F419 Anxiety disorder, unspecified: Secondary | ICD-10-CM

## 2023-09-01 NOTE — Telephone Encounter (Signed)
LOV: 05/06/23 Last fil: 07/07/23, 30 tablet 1 refill

## 2023-10-03 ENCOUNTER — Other Ambulatory Visit: Payer: Self-pay | Admitting: Family Medicine

## 2023-10-03 DIAGNOSIS — F419 Anxiety disorder, unspecified: Secondary | ICD-10-CM

## 2023-10-03 DIAGNOSIS — F429 Obsessive-compulsive disorder, unspecified: Secondary | ICD-10-CM

## 2023-11-03 ENCOUNTER — Other Ambulatory Visit: Payer: Self-pay | Admitting: Family Medicine

## 2023-11-03 DIAGNOSIS — F419 Anxiety disorder, unspecified: Secondary | ICD-10-CM

## 2023-11-03 DIAGNOSIS — F429 Obsessive-compulsive disorder, unspecified: Secondary | ICD-10-CM

## 2023-11-11 ENCOUNTER — Other Ambulatory Visit (HOSPITAL_COMMUNITY)
Admission: RE | Admit: 2023-11-11 | Discharge: 2023-11-11 | Disposition: A | Discharge status: Home / Self Care | Payer: 59 | Source: Ambulatory Visit | Attending: Family Medicine | Admitting: Family Medicine

## 2023-11-11 ENCOUNTER — Encounter: Payer: Self-pay | Admitting: Family Medicine

## 2023-11-11 ENCOUNTER — Ambulatory Visit: Payer: 59 | Admitting: Family Medicine

## 2023-11-11 VITALS — BP 102/66 | HR 67 | Temp 97.6°F | Ht 68.75 in | Wt 127.0 lb

## 2023-11-11 DIAGNOSIS — B9689 Other specified bacterial agents as the cause of diseases classified elsewhere: Secondary | ICD-10-CM

## 2023-11-11 DIAGNOSIS — Z13220 Encounter for screening for lipoid disorders: Secondary | ICD-10-CM | POA: Diagnosis not present

## 2023-11-11 DIAGNOSIS — Z0001 Encounter for general adult medical examination with abnormal findings: Secondary | ICD-10-CM | POA: Diagnosis not present

## 2023-11-11 DIAGNOSIS — Z113 Encounter for screening for infections with a predominantly sexual mode of transmission: Secondary | ICD-10-CM

## 2023-11-11 DIAGNOSIS — F419 Anxiety disorder, unspecified: Secondary | ICD-10-CM

## 2023-11-11 DIAGNOSIS — N760 Acute vaginitis: Secondary | ICD-10-CM

## 2023-11-11 DIAGNOSIS — F429 Obsessive-compulsive disorder, unspecified: Secondary | ICD-10-CM

## 2023-11-11 DIAGNOSIS — Z1159 Encounter for screening for other viral diseases: Secondary | ICD-10-CM

## 2023-11-11 HISTORY — DX: Encounter for general adult medical examination with abnormal findings: Z00.01

## 2023-11-11 LAB — CERVICOVAGINAL ANCILLARY ONLY
Bacterial Vaginitis (gardnerella): POSITIVE — AB
Candida Glabrata: NEGATIVE
Candida Vaginitis: NEGATIVE
Chlamydia: NEGATIVE
Comment: NEGATIVE
Comment: NEGATIVE
Comment: NEGATIVE
Comment: NEGATIVE
Comment: NEGATIVE
Comment: NORMAL
Neisseria Gonorrhea: NEGATIVE
Trichomonas: NEGATIVE

## 2023-11-11 LAB — CBC WITH DIFFERENTIAL/PLATELET
Basophils Absolute: 0 10*3/uL (ref 0.0–0.1)
Basophils Relative: 0.7 % (ref 0.0–3.0)
Eosinophils Absolute: 0.1 10*3/uL (ref 0.0–0.7)
Eosinophils Relative: 3.1 % (ref 0.0–5.0)
HCT: 39.2 % (ref 36.0–46.0)
Hemoglobin: 12.9 g/dL (ref 12.0–15.0)
Lymphocytes Relative: 41.5 % (ref 12.0–46.0)
Lymphs Abs: 1.7 10*3/uL (ref 0.7–4.0)
MCHC: 33 g/dL (ref 30.0–36.0)
MCV: 86.3 fL (ref 78.0–100.0)
Monocytes Absolute: 0.2 10*3/uL (ref 0.1–1.0)
Monocytes Relative: 6 % (ref 3.0–12.0)
Neutro Abs: 2 10*3/uL (ref 1.4–7.7)
Neutrophils Relative %: 48.7 % (ref 43.0–77.0)
Platelets: 291 10*3/uL (ref 150.0–400.0)
RBC: 4.54 Mil/uL (ref 3.87–5.11)
RDW: 15.7 % — ABNORMAL HIGH (ref 11.5–15.5)
WBC: 4.1 10*3/uL (ref 4.0–10.5)

## 2023-11-11 LAB — LIPID PANEL
Cholesterol: 194 mg/dL (ref 0–200)
HDL: 74.9 mg/dL (ref >39.00)
LDL Cholesterol: 106 mg/dL — ABNORMAL HIGH (ref 0–99)
NonHDL: 118.99
Total CHOL/HDL Ratio: 3
Triglycerides: 65 mg/dL (ref 0.0–149.0)
VLDL: 13 mg/dL (ref 0.0–40.0)

## 2023-11-11 LAB — COMPREHENSIVE METABOLIC PANEL
ALT: 13 U/L (ref 0–35)
AST: 19 U/L (ref 0–37)
Albumin: 4.7 g/dL (ref 3.5–5.2)
Alkaline Phosphatase: 37 U/L — ABNORMAL LOW (ref 39–117)
BUN: 10 mg/dL (ref 6–23)
CO2: 32 meq/L (ref 19–32)
Calcium: 9.7 mg/dL (ref 8.4–10.5)
Chloride: 102 meq/L (ref 96–112)
Creatinine, Ser: 0.87 mg/dL (ref 0.40–1.20)
GFR: 85.42 mL/min (ref >60.00)
Glucose, Bld: 92 mg/dL (ref 70–99)
Potassium: 4.4 meq/L (ref 3.5–5.1)
Sodium: 139 meq/L (ref 135–145)
Total Bilirubin: 0.4 mg/dL (ref 0.2–1.2)
Total Protein: 7.7 g/dL (ref 6.0–8.3)

## 2023-11-11 MED ORDER — SERTRALINE HCL 100 MG PO TABS: 150.0000 mg | ORAL_TABLET | Freq: Every day | ORAL | 5 refills | Status: DC

## 2023-11-11 MED ORDER — METRONIDAZOLE 500 MG PO TABS: 500.0000 mg | ORAL_TABLET | Freq: Two times a day (BID) | ORAL | 0 refills | Status: DC

## 2023-11-11 NOTE — Progress Notes (Signed)
Complete physical exam  Patient: Kristen Lane   DOB: [birth year]   38 y.o. Female  MRN: [id number]  Subjective:    Chief Complaint  Patient presents with   Annual Exam    fasting   She is here for a complete physical exam. OB/GYN- scheduled for March   [facility]   She has intermittent vaginal odor. New sexual partner.   Uses boric acid suppository prn  Requests STI testing and denies symptoms.   New small mole on the right side of her neck.     Health Maintenance  Topic Date Due   Pneumococcal Vaccination (1 of 2 - PCV) Never done   HIV Screening  Never done   Hepatitis C Screening  Never done   COVID-19 Vaccine (4 - 2024-25 season) 11/27/2023*   Flu Shot  01/18/2024*   Pap with HPV screening  02/23/2024   DTaP/Tdap/Td vaccine (8 - Td or Tdap) 02/13/2025   HPV Vaccine  Completed  *Topic was postponed. The date shown is not the original due date.    Wears seatbelt always, uses sunscreen, smoke detectors in home and functioning, does not text while driving, feels safe in home environment.  Depression screening:    11/11/2023    9:20 AM 01/07/2023   10:38 AM 01/28/2018    2:55 PM  Depression screen PHQ 2/9  Decreased Interest 0 1 0  Down, Depressed, Hopeless 0 1 0  PHQ - 2 Score 0 2 0  Altered sleeping  0   Tired, decreased energy  1   Change in appetite  0   Feeling bad or failure about yourself   0   Trouble concentrating  0   Moving slowly or fidgety/restless  0   Suicidal thoughts  0   PHQ-9 Score  3   Difficult doing work/chores  Not difficult at all    Anxiety Screening:    01/07/2023   10:38 AM  GAD 7 : Generalized Anxiety Score  Nervous, Anxious, on Edge 2  Control/stop worrying 3  Worry too much - different things 3  Trouble relaxing 2  Restless 0  Easily annoyed or irritable 2  Afraid - awful might happen 2  Total GAD 7 Score 14  Anxiety Difficulty Not difficult at all     Vision:Not within last year  and Dental: No current dental problems and Receives regular dental care  Patient Active Problem List   Diagnosis Date Noted   Encounter for general adult medical examination with abnormal findings 11/11/2023   Family history of hyperparathyroidism 05/06/2023   Nevus 03/25/2021   Environmental and seasonal allergies 12/08/2019   Body mass index (BMI) 19.9 or less, adult 04/26/2018   Anxiety 12/15/2011   Past Medical History:  Diagnosis Date   Allergy    Anemia    Anxiety    Asthma    Encounter for general adult medical examination with abnormal findings 11/11/2023   Past Surgical History:  Procedure Laterality Date   WISDOM TOOTH EXTRACTION     Social History   Tobacco Use   Smoking status: Never   Smokeless tobacco: Never  Vaping Use   Vaping status: Never Used  Substance Use Topics   Alcohol use: Yes    Alcohol/week: 3.0 standard drinks of alcohol    Types: 1 Glasses of wine, 1 Cans of beer, 1 Shots of liquor per week    Comment: 3 in last month   Drug use: No  Patient Care Team: Avanell Shackleton, NP-C as PCP - General (Family Medicine)   Outpatient Medications Prior to Visit  Medication Sig   albuterol (PROVENTIL HFA;VENTOLIN HFA) 108 (90 BASE) MCG/ACT inhaler Inhale 2 puffs into the lungs every 6 (six) hours as needed for wheezing or shortness of breath.   clonazePAM (KLONOPIN) 1 MG tablet Take 1 tablet (1 mg total) by mouth daily as needed for anxiety.   EPINEPHrine (EPIPEN 2-PAK) 0.3 mg/0.3 mL IJ SOAJ injection Inject 0.3 mLs (0.3 mg total) into the muscle once as needed (for severe allergic reaction). CAll 911 immediately if you have to use this medicine   Fexofenadine HCl (ALLERGY 24-HR PO) Take by mouth.   Prenatal Vit-Fe Fumarate-FA (PRENATAL VITAMIN PO) Take by mouth.   [DISCONTINUED] sertraline (ZOLOFT) 100 MG tablet Take 1 tablet (100 mg total) by mouth daily.   [DISCONTINUED] sertraline (ZOLOFT) 50 MG tablet Take 1  tablet (50 mg total) by mouth daily.   No facility-administered medications prior to visit.    Review of Systems  Constitutional:  Negative for chills, fever, malaise/fatigue and weight loss.  HENT:  Negative for congestion, ear pain, sinus pain and sore throat.   Eyes:  Negative for blurred vision, double vision and pain.  Respiratory:  Negative for cough, shortness of breath and wheezing.   Cardiovascular:  Negative for chest pain, palpitations and leg swelling.  Gastrointestinal:  Negative for abdominal pain, constipation, diarrhea, nausea and vomiting.  Genitourinary:  Negative for dysuria, frequency and urgency.  Musculoskeletal:  Negative for back pain, joint pain and myalgias.  Skin:  Negative for rash.  Neurological:  Negative for dizziness, tingling, focal weakness and headaches.  Psychiatric/Behavioral:  Negative for depression. The patient is not nervous/anxious.        Objective:    BP 102/66 (BP Location: Left Arm, Patient Position: Sitting, Cuff Size: Normal)   Pulse 67   Temp 97.6 F (36.4 C) (Temporal)   Ht 5' 8.75" (1.746 m)   Wt 127 lb (57.6 kg)   SpO2 99%   BMI 18.89 kg/m  BP Readings from Last 3 Encounters:  11/11/23 102/66  05/06/23 98/66  02/04/23 102/64   Wt Readings from Last 3 Encounters:  11/11/23 127 lb (57.6 kg)  05/06/23 124 lb (56.2 kg)  02/04/23 124 lb (56.2 kg)    Physical Exam Constitutional:      General: She is not in acute distress.    Appearance: She is not ill-appearing.  HENT:     Right Ear: Tympanic membrane, ear canal and external ear normal.     Left Ear: Tympanic membrane, ear canal and external ear normal.     Nose: Nose normal.     Mouth/Throat:     Mouth: Mucous membranes are moist.     Pharynx: Oropharynx is clear.  Eyes:     Extraocular Movements: Extraocular movements intact.     Conjunctiva/sclera: Conjunctivae normal.     Pupils: Pupils are equal, round, and reactive to light.  Neck:     Thyroid: No thyroid  mass, thyromegaly or thyroid tenderness.  Cardiovascular:     Rate and Rhythm: Normal rate and regular rhythm.     Pulses: Normal pulses.     Heart sounds: Normal heart sounds.  Pulmonary:     Effort: Pulmonary effort is normal.     Breath sounds: Normal breath sounds.  Abdominal:     General: Bowel sounds are normal.     Palpations: Abdomen is soft.  Tenderness: There is no abdominal tenderness. There is no right CVA tenderness, left CVA tenderness, guarding or rebound.  Musculoskeletal:        General: Normal range of motion.     Cervical back: Normal range of motion and neck supple. No tenderness.     Right lower leg: No edema.     Left lower leg: No edema.  Lymphadenopathy:     Cervical: No cervical adenopathy.  Skin:    General: Skin is warm and dry.     Findings: No lesion or rash.     Comments: Pinpoint size dark brown, regular shaped mole on right lateral aspect of her neck.   Neurological:     General: No focal deficit present.     Mental Status: She is alert and oriented to person, place, and time.     Cranial Nerves: No cranial nerve deficit.     Sensory: No sensory deficit.     Motor: No weakness.     Gait: Gait normal.  Psychiatric:        Mood and Affect: Mood normal.        Behavior: Behavior normal.        Thought Content: Thought content normal.      Results for orders placed or performed in visit on 11/11/23  Lipid panel  Result Value Ref Range   Cholesterol 194 0 - 200 mg/dL   Triglycerides 10.2 0.0 - 149.0 mg/dL   HDL 72.53 >66.44 mg/dL   VLDL 03.4 0.0 - 74.2 mg/dL   LDL Cholesterol 595 (H) 0 - 99 mg/dL   Total CHOL/HDL Ratio 3    NonHDL 118.99   Comprehensive metabolic panel  Result Value Ref Range   Sodium 139 135 - 145 mEq/L   Potassium 4.4 3.5 - 5.1 mEq/L   Chloride 102 96 - 112 mEq/L   CO2 32 19 - 32 mEq/L   Glucose, Bld 92 70 - 99 mg/dL   BUN 10 6 - 23 mg/dL   Creatinine, Ser 6.38 0.40 - 1.20 mg/dL   Total Bilirubin 0.4 0.2 - 1.2  mg/dL   Alkaline Phosphatase 37 (L) 39 - 117 U/L   AST 19 0 - 37 U/L   ALT 13 0 - 35 U/L   Total Protein 7.7 6.0 - 8.3 g/dL   Albumin 4.7 3.5 - 5.2 g/dL   GFR 75.64 >33.29 mL/min   Calcium 9.7 8.4 - 10.5 mg/dL  CBC with Differential/Platelet  Result Value Ref Range   WBC 4.1 4.0 - 10.5 K/uL   RBC 4.54 3.87 - 5.11 Mil/uL   Hemoglobin 12.9 12.0 - 15.0 g/dL   HCT 51.8 84.1 - 66.0 %   MCV 86.3 78.0 - 100.0 fl   MCHC 33.0 30.0 - 36.0 g/dL   RDW 63.0 (H) 16.0 - 10.9 %   Platelets 291.0 150.0 - 400.0 K/uL   Neutrophils Relative % 48.7 43.0 - 77.0 %   Lymphocytes Relative 41.5 12.0 - 46.0 %   Monocytes Relative 6.0 3.0 - 12.0 %   Eosinophils Relative 3.1 0.0 - 5.0 %   Basophils Relative 0.7 0.0 - 3.0 %   Neutro Abs 2.0 1.4 - 7.7 K/uL   Lymphs Abs 1.7 0.7 - 4.0 K/uL   Monocytes Absolute 0.2 0.1 - 1.0 K/uL   Eosinophils Absolute 0.1 0.0 - 0.7 K/uL   Basophils Absolute 0.0 0.0 - 0.1 K/uL  Cervicovaginal ancillary only  Result Value Ref Range   Neisseria Gonorrhea Negative  Chlamydia Negative    Trichomonas Negative    Bacterial Vaginitis (gardnerella) Positive (A)    Candida Vaginitis Negative    Candida Glabrata Negative    Comment      Normal Reference Range Bacterial Vaginosis - Negative   Comment Normal Reference Range Candida Species - Negative    Comment Normal Reference Range Candida Galbrata - Negative    Comment Normal Reference Range Trichomonas - Negative    Comment Normal Reference Ranger Chlamydia - Negative    Comment      Normal Reference Range Neisseria Gonorrhea - Negative      Assessment & Plan:    Routine Health Maintenance and Physical Exam  Problem List Items Addressed This Visit     Anxiety   Relevant Medications   sertraline (ZOLOFT) 100 MG tablet   Encounter for general adult medical examination with abnormal findings - Primary   Relevant Orders   CBC with Differential/Platelet (Completed)   Comprehensive metabolic panel (Completed)   Other  Visit Diagnoses       Screening examination for STI       Relevant Orders   Hepatitis C antibody   Hepatitis B surface antigen   HIV Antibody (routine testing w rflx)   RPR   Cervicovaginal ancillary only (Completed)     Encounter for screening for other viral diseases       Relevant Orders   Hepatitis C antibody   HIV Antibody (routine testing w rflx)     Screening for lipid disorders       Relevant Orders   Lipid panel (Completed)     Obsessive-compulsive disorder, unspecified type       Relevant Medications   sertraline (ZOLOFT) 100 MG tablet     BV (bacterial vaginosis)       Relevant Medications   metroNIDAZOLE (FLAGYL) 500 MG tablet      Preventive health care reviewed.  Sees OB/GYN. Counseling on healthy lifestyle including diet and exercise.  Recommend regular dental and eye exams.  Immunizations reviewed.  Discussed safety. Mood is good. Continue sertraline 150 mg daily.  BV- metronidazole prescribed.     Return in about 6 months (around 05/10/2024).     Hetty Blend, NP-C

## 2023-11-12 LAB — RPR: RPR Ser Ql: NONREACTIVE

## 2023-11-12 LAB — HIV ANTIBODY (ROUTINE TESTING W REFLEX): HIV 1&2 Ab, 4th Generation: NONREACTIVE

## 2023-11-12 LAB — HEPATITIS B SURFACE ANTIGEN: Hepatitis B Surface Ag: NONREACTIVE

## 2023-11-12 LAB — HEPATITIS C ANTIBODY: Hepatitis C Ab: NONREACTIVE

## 2023-12-28 ENCOUNTER — Other Ambulatory Visit (HOSPITAL_COMMUNITY)
Admission: RE | Admit: 2023-12-28 | Discharge: 2023-12-28 | Disposition: A | Discharge status: Home / Self Care | Source: Ambulatory Visit | Attending: Obstetrics and Gynecology | Admitting: Obstetrics and Gynecology

## 2023-12-28 ENCOUNTER — Ambulatory Visit (INDEPENDENT_AMBULATORY_CARE_PROVIDER_SITE_OTHER): Payer: 59 | Admitting: Obstetrics and Gynecology

## 2023-12-28 ENCOUNTER — Encounter: Payer: Self-pay | Admitting: Obstetrics and Gynecology

## 2023-12-28 VITALS — BP 110/64 | HR 88 | Ht 68.75 in | Wt 129.0 lb

## 2023-12-28 DIAGNOSIS — Z01419 Encounter for gynecological examination (general) (routine) without abnormal findings: Secondary | ICD-10-CM | POA: Diagnosis present

## 2023-12-28 DIAGNOSIS — N898 Other specified noninflammatory disorders of vagina: Secondary | ICD-10-CM

## 2023-12-28 NOTE — Progress Notes (Signed)
 38 y.o. y.o. female here for annual exam. G0P0 Patient's last menstrual period was 12/21/2023 (exact date). Period Cycle (Days): 28 Period Duration (Days): 7 Period Pattern: Regular Menstrual Flow: Light, Moderate Menstrual Control: Tampon Dysmenorrhea: (!) Mild Dysmenorrhea Symptoms: Cramping  Paraguard IUD in 2022 and pleased with it Pap smear 2022 at [facility]. History of one remote abnormal pap smear Labs with NP  Body mass index is 19.19 kg/m.     11/11/2023    9:20 AM 01/07/2023   10:38 AM 01/28/2018    2:55 PM  Depression screen PHQ 2/9  Decreased Interest 0 1 0  Down, Depressed, Hopeless 0 1 0  PHQ - 2 Score 0 2 0  Altered sleeping  0   Tired, decreased energy  1   Change in appetite  0   Feeling bad or failure about yourself   0   Trouble concentrating  0   Moving slowly or fidgety/restless  0   Suicidal thoughts  0   PHQ-9 Score  3   Difficult doing work/chores  Not difficult at all     Blood pressure 110/64, pulse 88, height 5' 8.75" (1.746 m), weight 129 lb (58.5 kg), last menstrual period 12/21/2023, SpO2 99%.  No results found for: "DIAGPAP", "HPVHIGH", "ADEQPAP"  GYN HISTORY: No results found for: "DIAGPAP", "HPVHIGH", "ADEQPAP"  OB History  Gravida Para Term Preterm AB Living  0 0 0 0 0 0  SAB IAB Ectopic Multiple Live Births  0 0 0 0 0    Past Medical History:  Diagnosis Date  . Allergy   . Anemia   . Anxiety   . Asthma   . Encounter for general adult medical examination with abnormal findings 11/11/2023    Past Surgical History:  Procedure Laterality Date  . WISDOM TOOTH EXTRACTION      Current Outpatient Medications on File Prior to Visit  Medication Sig Dispense Refill  . albuterol (PROVENTIL HFA;VENTOLIN HFA) 108 (90 BASE) MCG/ACT inhaler Inhale 2 puffs into the lungs every 6 (six) hours as needed for wheezing or shortness of breath. 1 Inhaler 3  . clonazePAM (KLONOPIN) 1 MG tablet Take 1 tablet (1 mg total) by mouth daily as  needed for anxiety. 30 tablet 0  . EPINEPHrine (EPIPEN 2-PAK) 0.3 mg/0.3 mL IJ SOAJ injection Inject 0.3 mLs (0.3 mg total) into the muscle once as needed (for severe allergic reaction). CAll 911 immediately if you have to use this medicine 1 Device 1  . Fexofenadine HCl (ALLERGY 24-HR PO) Take by mouth.    . Prenatal Vit-Fe Fumarate-FA (PRENATAL VITAMIN PO) Take by mouth.    . sertraline (ZOLOFT) 100 MG tablet Take 1.5 tablets (150 mg total) by mouth daily. 45 tablet 5   No current facility-administered medications on file prior to visit.    Social History   Socioeconomic History  . Marital status: Single    Spouse name: Lonna Duval  . Number of children: 0  . Years of education: Master's Degree  . Highest education level: Master's degree (e.g., MA, MS, MEng, MEd, MSW, MBA)  Occupational History  . Occupation: Orthoptist: [employer]  Tobacco Use  . Smoking status: Never  . Smokeless tobacco: Never  Vaping Use  . Vaping status: Never Used  Substance and Sexual Activity  . Alcohol use: Yes    Alcohol/week: 3.0 standard drinks of alcohol    Types: 1 Glasses of wine, 1 Cans of beer, 1 Shots  of liquor per week    Comment: 3 in last month  . Drug use: No  . Sexual activity: Yes    Birth control/protection: I.U.D.  Other Topics Concern  . Not on file  Social History Narrative   Lives with her husband (married 04/18/2017).   Parents live nearby.   Sister lives in [place].   Social Drivers of Health   Financial Resource Strain: Low Risk  (01/31/2023)   Overall Financial Resource Strain (CARDIA)   . Difficulty of Paying Living Expenses: Not hard at all  Food Insecurity: No Food Insecurity (01/31/2023)   Hunger Vital Sign   . Worried About Programme researcher, broadcasting/film/video in the Last Year: Never true   . Ran Out of Food in the Last Year: Never true  Transportation Needs: No Transportation Needs (01/31/2023)   PRAPARE - Transportation   . Lack of  Transportation (Medical): No   . Lack of Transportation (Non-Medical): No  Physical Activity: Insufficiently Active (01/31/2023)   Exercise Vital Sign   . Days of Exercise per Week: 3 days   . Minutes of Exercise per Session: 40 min  Stress: Stress Concern Present (01/31/2023)   [employer] of Occupational Health - Occupational Stress Questionnaire   . Feeling of Stress : Rather much  Social Connections: Unknown (04/29/2023)   Received from [facility]   Social Network   . Social Network: Not on file  Intimate Partner Violence: Unknown (04/29/2023)   Received from [facility]   HITS   . Physically Hurt: Not on file   . Insult or Talk Down To: Not on file   . Threaten Physical Harm: Not on file   . Scream or Curse: Not on file    Family History  Problem Relation Age of Onset  . Prostate cancer Father   . Hyperparathyroidism Sister   . Breast cancer Maternal Grandmother   . Cancer Maternal Grandmother   . Emphysema Maternal Grandfather   . Breast cancer Paternal Grandmother   . Cancer Paternal Grandmother      Allergies  Allergen Reactions  . Bee Venom Hives and Nausea And Vomiting    Light headed LOC  . Molds & Smuts Itching      Patient's last menstrual period was Patient's last menstrual period was 12/21/2023 (exact date)..            Review of Systems Alls systems reviewed and are negative.     Physical Exam Constitutional:      Appearance: Normal appearance.  Genitourinary:     Vulva and urethral meatus normal.     No lesions in the vagina.     Right Labia: No rash, lesions or skin changes.    Left Labia: No lesions, skin changes or rash.    No vaginal discharge or tenderness.     No vaginal prolapse present.    No vaginal atrophy present.     Right Adnexa: not tender, not palpable and no mass present.    Left Adnexa: not tender, not palpable and no mass present.    No cervical motion tenderness or discharge.     IUD strings visualized.      Uterus is not enlarged, tender or irregular.  Breasts:    Right: Normal.     Left: Normal.  HENT:     Head: Normocephalic.  Neck:     Thyroid: No thyroid mass, thyromegaly or thyroid tenderness.  Cardiovascular:     Rate and Rhythm: Normal rate and regular  rhythm.     Heart sounds: Normal heart sounds, S1 normal and S2 normal.  Pulmonary:     Effort: Pulmonary effort is normal.     Breath sounds: Normal breath sounds and air entry.  Abdominal:     General: There is no distension.     Palpations: Abdomen is soft. There is no mass.     Tenderness: There is no abdominal tenderness. There is no guarding or rebound.  Musculoskeletal:        General: Normal range of motion.     Cervical back: Full passive range of motion without pain, normal range of motion and neck supple. No tenderness.     Right lower leg: No edema.     Left lower leg: No edema.  Neurological:     Mental Status: She is alert.  Skin:    General: Skin is warm.  Psychiatric:        Mood and Affect: Mood normal.        Behavior: Behavior normal.        Thought Content: Thought content normal.  Vitals and nursing note reviewed. Exam conducted with a chaperone present.      A:         Well Woman GYN exam                             P:        Pap smear collected today Encouraged annual mammogram screening. Recently treated for BV and would like to do a repeat swab. Colon cancer screening not indicated DXA not indicated Labs and immunizations to do with PMD Discussed breast self exams Encouraged healthy lifestyle practices  No follow-ups on file.  Earley Favor

## 2023-12-29 LAB — SURESWAB® ADVANCED VAGINITIS PLUS,TMA
C. trachomatis RNA, TMA: NOT DETECTED
CANDIDA SPECIES: NOT DETECTED
Candida glabrata: NOT DETECTED
N. gonorrhoeae RNA, TMA: NOT DETECTED
SURESWAB(R) ADV BACTERIAL VAGINOSIS(BV),TMA: NEGATIVE
TRICHOMONAS VAGINALIS (TV),TMA: NOT DETECTED

## 2023-12-30 ENCOUNTER — Encounter: Payer: Self-pay | Admitting: Obstetrics and Gynecology

## 2024-01-01 LAB — CYTOLOGY - PAP
Comment: NEGATIVE
Diagnosis: UNDETERMINED — AB
High risk HPV: NEGATIVE

## 2024-02-16 ENCOUNTER — Encounter: Payer: Self-pay | Admitting: Family Medicine

## 2024-02-16 ENCOUNTER — Other Ambulatory Visit: Payer: Self-pay | Admitting: Family Medicine

## 2024-03-07 ENCOUNTER — Encounter: Payer: Self-pay | Admitting: Family Medicine

## 2024-03-07 ENCOUNTER — Ambulatory Visit: Admitting: Family Medicine

## 2024-03-07 ENCOUNTER — Ambulatory Visit: Payer: Self-pay

## 2024-03-07 VITALS — BP 100/58 | HR 114 | Temp 98.1°F | Resp 18 | Ht 68.75 in | Wt 124.0 lb

## 2024-03-07 DIAGNOSIS — J069 Acute upper respiratory infection, unspecified: Secondary | ICD-10-CM | POA: Diagnosis not present

## 2024-03-07 LAB — POC COVID19 BINAXNOW: SARS Coronavirus 2 Ag: NEGATIVE

## 2024-03-07 LAB — POCT INFLUENZA A/B
Influenza A, POC: NEGATIVE
Influenza B, POC: NEGATIVE

## 2024-03-07 MED ORDER — FLUTICASONE PROPIONATE 50 MCG/ACT NA SUSP: 2.0000 | Freq: Every day | NASAL | 0 refills | Status: DC

## 2024-03-07 NOTE — Progress Notes (Signed)
 Assessment & Plan:  1. Viral URI (Primary) Education provided on viral URI.  Discussed typical duration and progression of viral illnesses.  Encouraged symptom management including throat lozenges, chloraseptic spray, warm salt water gargles, hot tea/honey, cough syrup (Delsym), Tylenol Cold Day and Night, Vicks, and a humidifier at night.  - POCT Influenza A/B - POC COVID-19 BinaxNow - fluticasone  (FLONASE ) 50 MCG/ACT nasal spray; Place 2 sprays into both nostrils daily.  Dispense: 16 g; Refill: 0  Results for orders placed or performed in visit on 03/07/24  POCT Influenza A/B  Result Value Ref Range   Influenza A, POC Negative Negative   Influenza B, POC Negative Negative  POC COVID-19 BinaxNow  Result Value Ref Range   SARS Coronavirus 2 Ag Negative Negative    Follow up plan: Return if symptoms worsen or fail to improve.  Hershel Los, MSN, APRN, FNP-C  Subjective:  HPI: Kristen Lane is a 38 y.o. female presenting on 03/07/2024 for Headache (No fever, pos for Chills, sweats, body aches, diarrhea - started yesterday AM //Every 2-3 weeks recent has been sick with something.)  Patient complains of headache, diarrhea, postnasal drip body aches, chills, and sweats. She denies fever. Onset of symptoms was 1 day ago, gradually worsening since that time. She is drinking plenty of fluids. Evaluation to date: none. Treatment to date: Advil yesterday. She has a history of childhood asthma and allergies.  She has not needed her albuterol  inhaler in years.  She does not smoke.  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   ROS: Negative unless specifically indicated above in HPI.   Relevant past medical history reviewed and updated as indicated.   Allergies and medications reviewed and updated.   Current Outpatient Medications:    albuterol  (PROVENTIL  HFA;VENTOLIN  HFA) 108 (90 BASE) MCG/ACT inhaler, Inhale 2 puffs into the lungs every 6 (six) hours as needed for wheezing or  shortness of breath., Disp: 1 Inhaler, Rfl: 3   clonazePAM  (KLONOPIN ) 1 MG tablet, Take 1 tablet (1 mg total) by mouth daily as needed for anxiety., Disp: 30 tablet, Rfl: 0   Fexofenadine HCl (ALLERGY 24-HR PO), Take by mouth., Disp: , Rfl:    Prenatal Vit-Fe Fumarate-FA (PRENATAL VITAMIN PO), Take by mouth., Disp: , Rfl:    sertraline  (ZOLOFT ) 100 MG tablet, Take 100 mg by mouth daily., Disp: , Rfl:    EPINEPHrine  (EPIPEN  2-PAK) 0.3 mg/0.3 mL IJ SOAJ injection, Inject 0.3 mLs (0.3 mg total) into the muscle once as needed (for severe allergic reaction). CAll 911 immediately if you have to use this medicine (Patient not taking: Reported on 03/07/2024), Disp: 1 Device, Rfl: 1   paragard intrauterine copper IUD IUD, 1 Device by Intrauterine route., Disp: , Rfl:   Allergies  Allergen Reactions   Bee Venom Hives and Nausea And Vomiting    Light headed LOC   Molds & Smuts Itching    Objective:   BP (!) 100/58   Pulse (!) 114   Temp 98.1 F (36.7 C)   Resp 18   Ht 5' 8.75" (1.746 m)   Wt 124 lb (56.2 kg)   LMP 02/11/2024 (Exact Date)   SpO2 99%   BMI 18.45 kg/m    Physical Exam Vitals reviewed.  Constitutional:      General: She is not in acute distress.    Appearance: Normal appearance. She is not ill-appearing, toxic-appearing or diaphoretic.  HENT:     Head: Normocephalic and atraumatic.  Right Ear: Ear canal and external ear normal. There is no impacted cerumen. Tympanic membrane is erythematous and bulging. Tympanic membrane is not injected, scarred or perforated.     Left Ear: Ear canal and external ear normal. There is no impacted cerumen. Tympanic membrane is erythematous and bulging. Tympanic membrane is not injected, scarred or perforated.     Nose: Congestion present. No rhinorrhea.     Right Sinus: No maxillary sinus tenderness or frontal sinus tenderness.     Left Sinus: No maxillary sinus tenderness or frontal sinus tenderness.     Mouth/Throat:     Mouth: Mucous  membranes are moist.     Pharynx: Oropharynx is clear. No oropharyngeal exudate or posterior oropharyngeal erythema.  Eyes:     General: No scleral icterus.       Right eye: No discharge.        Left eye: No discharge.     Conjunctiva/sclera: Conjunctivae normal.  Cardiovascular:     Rate and Rhythm: Normal rate and regular rhythm.     Heart sounds: Normal heart sounds. No murmur heard.    No friction rub. No gallop.  Pulmonary:     Effort: Pulmonary effort is normal. No respiratory distress.     Breath sounds: Normal breath sounds. No stridor. No wheezing, rhonchi or rales.  Musculoskeletal:        General: Normal range of motion.     Cervical back: Normal range of motion.  Lymphadenopathy:     Cervical: No cervical adenopathy.  Skin:    General: Skin is warm and dry.     Capillary Refill: Capillary refill takes less than 2 seconds.  Neurological:     General: No focal deficit present.     Mental Status: She is alert and oriented to person, place, and time. Mental status is at baseline.  Psychiatric:        Mood and Affect: Mood normal.        Behavior: Behavior normal.        Thought Content: Thought content normal.        Judgment: Judgment normal.

## 2024-03-07 NOTE — Telephone Encounter (Signed)
  Chief Complaint: Influenza- Suspected  Symptoms: Yesterday, 24 Hours Frequency: Chills, Body Aches, Diarrhea, Sweating,  Earache Pertinent Negatives: Patient denies fever, congestion, chest pain, or dyspnea  Disposition: [] ED /[] Urgent Care (no appt availability in office) / [x] Appointment(In office/virtual)/ []  [place] [facility]/ [] Home Care/ [] Refused Recommended Disposition /[] [facility]/ []  Follow-up with PCP  Additional Notes: LA is being triaged for flu-like symptoms. The patient was previously put on an antibiotic a couple of weeks ago for bronchitis. The patient reports a concern that she is getting sick at least once a month for the past six months, which brings about concerns. The patient reports symptom onset about 24 hours ago. Denies having taken a COVID or Influenza test.  In office appointment made for this morning.   Reason for Disposition  Earache  Answer Assessment - Initial Assessment Questions 1. WORST SYMPTOM: "What is your worst symptom?" (e.g., cough, runny nose, muscle aches, headache, sore throat, fever)      Body Aches 2. ONSET: "When did your flu symptoms start?"      24 Hours Ago  3. COUGH: "How bad is the cough?"       No  4. RESPIRATORY DISTRESS: "Describe your breathing."      None  5. FEVER: "Do you have a fever?" If Yes, ask: "What is your temperature, how was it measured, and when did it start?"     No  6. EXPOSURE: "Were you exposed to someone with influenza?"       Unsure  7. FLU VACCINE: "Did you get a flu shot this year?"     No  8. HIGH RISK DISEASE: "Do you have any chronic medical problems?" (e.g., heart or lung disease, asthma, weak immune system, or other HIGH RISK conditions)     No  9. PREGNANCY: "Is there any chance you are pregnant?" "When was your last menstrual period?"     No, LMP April 24th, 2025  10. OTHER SYMPTOMS: "Do you have any other symptoms?"  (e.g., runny nose, muscle aches, headache, sore  throat)       None  Protocols used: Influenza (Flu) - [facility]

## 2024-03-07 NOTE — Patient Instructions (Signed)
 Throat lozenges, chloraseptic spray, warm salt water gargles, hot tea/honey, cough syrup (Delsym), Tylenol Cold Day and Night, Vicks, and a humidifier at night.

## 2024-03-15 ENCOUNTER — Encounter: Payer: Self-pay | Admitting: Family Medicine

## 2024-03-15 ENCOUNTER — Ambulatory Visit: Payer: Self-pay

## 2024-03-15 ENCOUNTER — Ambulatory Visit: Payer: Self-pay | Admitting: Family Medicine

## 2024-03-15 ENCOUNTER — Ambulatory Visit: Admitting: Family Medicine

## 2024-03-15 VITALS — BP 98/54 | HR 65 | Temp 97.9°F | Ht 68.75 in | Wt 121.0 lb

## 2024-03-15 DIAGNOSIS — R197 Diarrhea, unspecified: Secondary | ICD-10-CM

## 2024-03-15 DIAGNOSIS — R109 Unspecified abdominal pain: Secondary | ICD-10-CM | POA: Diagnosis not present

## 2024-03-15 LAB — CBC WITH DIFFERENTIAL/PLATELET
Basophils Absolute: 0 10*3/uL (ref 0.0–0.1)
Basophils Relative: 0.6 % (ref 0.0–3.0)
Eosinophils Absolute: 0.1 10*3/uL (ref 0.0–0.7)
Eosinophils Relative: 2 % (ref 0.0–5.0)
HCT: 35.8 % — ABNORMAL LOW (ref 36.0–46.0)
Hemoglobin: 12.1 g/dL (ref 12.0–15.0)
Lymphocytes Relative: 30.5 % (ref 12.0–46.0)
Lymphs Abs: 1.6 10*3/uL (ref 0.7–4.0)
MCHC: 33.7 g/dL (ref 30.0–36.0)
MCV: 84.3 fl (ref 78.0–100.0)
Monocytes Absolute: 0.4 10*3/uL (ref 0.1–1.0)
Monocytes Relative: 7.9 % (ref 3.0–12.0)
Neutro Abs: 3.1 10*3/uL (ref 1.4–7.7)
Neutrophils Relative %: 59 % (ref 43.0–77.0)
Platelets: 340 10*3/uL (ref 150.0–400.0)
RBC: 4.24 Mil/uL (ref 3.87–5.11)
RDW: 13.8 % (ref 11.5–15.5)
WBC: 5.3 10*3/uL (ref 4.0–10.5)

## 2024-03-15 LAB — COMPREHENSIVE METABOLIC PANEL WITH GFR
ALT: 12 U/L (ref 0–35)
AST: 14 U/L (ref 0–37)
Albumin: 4.4 g/dL (ref 3.5–5.2)
Alkaline Phosphatase: 40 U/L (ref 39–117)
BUN: 7 mg/dL (ref 6–23)
CO2: 27 meq/L (ref 19–32)
Calcium: 9.4 mg/dL (ref 8.4–10.5)
Chloride: 104 meq/L (ref 96–112)
Creatinine, Ser: 0.7 mg/dL (ref 0.40–1.20)
GFR: 110.62 mL/min (ref >60.00)
Glucose, Bld: 85 mg/dL (ref 70–99)
Potassium: 3.8 meq/L (ref 3.5–5.1)
Sodium: 140 meq/L (ref 135–145)
Total Bilirubin: 0.4 mg/dL (ref 0.2–1.2)
Total Protein: 7.3 g/dL (ref 6.0–8.3)

## 2024-03-15 NOTE — Progress Notes (Signed)
 Subjective:     Patient ID: Kristen Lane, female    DOB: [birth year], 38 y.o.   MRN: [id number]  Chief Complaint  Patient presents with   Diarrhea    Saw brittany joyce 5/19 for URI, was going on then as well. No other symptoms.     Diarrhea  Associated symptoms include abdominal pain. Pertinent negatives include no chills, fever, headaches, myalgias or vomiting.    History of Present Illness         Here with c/o diarrhea x 8-9 days.  Mainly watery and yellowish appearance.  No fever, chills, N/V, abdominal pain.  Denies recent travel out of country.  She did take antibiotics earlier this month.  States she is drinking plenty of fluids and eating the brat diet.  Feels hydrated.  She started a probiotic.  Initially her symptoms also isolated chills and bodyaches.  She was negative for COVID at that time.    LMP: Last week    Health Maintenance Due  Topic Date Due   Pneumococcal Vaccine 31-72 Years old (1 of 2 - PCV) Never done    Past Medical History:  Diagnosis Date   Allergy    Anemia    Anxiety    Asthma    Encounter for general adult medical examination with abnormal findings 11/11/2023    Past Surgical History:  Procedure Laterality Date   WISDOM TOOTH EXTRACTION      Family History  Problem Relation Age of Onset   Prostate cancer Father    Hyperparathyroidism Sister    Breast cancer Maternal Grandmother    Cancer Maternal Grandmother    Emphysema Maternal Grandfather    Breast cancer Paternal Grandmother    Cancer Paternal Grandmother     Social History   Socioeconomic History   Marital status: Single    Spouse name: Patsy Booze   Number of children: 0   Years of education: Master's Degree   Highest education level: Master's degree (e.g., MA, MS, MEng, MEd, MSW, MBA)  Occupational History   Occupation: Orthoptist: [employer]  Tobacco Use   Smoking status: Never   Smokeless tobacco: Never   Vaping Use   Vaping status: Never Used  Substance and Sexual Activity   Alcohol use: Yes    Alcohol/week: 3.0 standard drinks of alcohol    Types: 1 Glasses of wine, 1 Cans of beer, 1 Shots of liquor per week    Comment: 3 in last month   Drug use: No   Sexual activity: Yes    Birth control/protection: I.U.D.  Other Topics Concern   Not on file  Social History Narrative   Lives with her husband (married 04/18/2017).   Parents live nearby.   Sister lives in [place] .   Social Drivers of Corporate investment banker Strain: Low Risk  (01/31/2023)   Overall Financial Resource Strain (CARDIA)    Difficulty of Paying Living Expenses: Not hard at all  Food Insecurity: No Food Insecurity (01/31/2023)   Hunger Vital Sign    Worried About Running Out of Food in the Last Year: Never true    Ran Out of Food in the Last Year: Never true  Transportation Needs: No Transportation Needs (01/31/2023)   PRAPARE - Administrator, Civil Service (Medical): No    Lack of Transportation (Non-Medical): No  Physical Activity: Insufficiently Active (01/31/2023)   Exercise Vital Sign    Days of Exercise per Week: 3  days    Minutes of Exercise per Session: 40 min  Stress: Stress Concern Present (01/31/2023)   [employer] of Occupational Health - Occupational Stress Questionnaire    Feeling of Stress : Rather much  Social Connections: Unknown (04/29/2023)   Received from [facility]   Social Network    Social Network: Not on file  Intimate Partner Violence: Unknown (04/29/2023)   Received from [facility]   HITS    Physically Hurt: Not on file    Insult or Talk Down To: Not on file    Threaten Physical Harm: Not on file    Scream or Curse: Not on file    Outpatient Medications Prior to Visit  Medication Sig Dispense Refill   albuterol  (PROVENTIL  HFA;VENTOLIN  HFA) 108 (90 BASE) MCG/ACT inhaler Inhale 2 puffs into the lungs every 6 (six) hours as needed for wheezing or shortness of  breath. 1 Inhaler 3   clonazePAM  (KLONOPIN ) 1 MG tablet Take 1 tablet (1 mg total) by mouth daily as needed for anxiety. 30 tablet 0   EPINEPHrine  (EPIPEN  2-PAK) 0.3 mg/0.3 mL IJ SOAJ injection Inject 0.3 mLs (0.3 mg total) into the muscle once as needed (for severe allergic reaction). CAll 911 immediately if you have to use this medicine 1 Device 1   Fexofenadine HCl (ALLERGY 24-HR PO) Take by mouth.     fluticasone  (FLONASE ) 50 MCG/ACT nasal spray Place 2 sprays into both nostrils daily. 16 g 0   paragard intrauterine copper IUD IUD 1 Device by Intrauterine route.     Prenatal Vit-Fe Fumarate-FA (PRENATAL VITAMIN PO) Take by mouth.     sertraline  (ZOLOFT ) 100 MG tablet Take 100 mg by mouth daily.     No facility-administered medications prior to visit.    Allergies  Allergen Reactions   Bee Venom Hives and Nausea And Vomiting    Light headed LOC   Molds & Smuts Itching    Review of Systems  Constitutional:  Positive for malaise/fatigue. Negative for chills and fever.  HENT:  Negative for congestion and sore throat.   Respiratory:  Negative for shortness of breath.   Cardiovascular:  Negative for chest pain, palpitations and leg swelling.  Gastrointestinal:  Positive for abdominal pain and diarrhea. Negative for blood in stool, constipation, nausea and vomiting.  Genitourinary:  Negative for dysuria, frequency and urgency.  Musculoskeletal:  Negative for back pain, joint pain and myalgias.  Neurological:  Negative for dizziness, focal weakness and headaches.       Objective:     Physical Exam Constitutional:      General: She is not in acute distress.    Appearance: She is not ill-appearing.  HENT:     Mouth/Throat:     Mouth: Mucous membranes are moist.  Eyes:     Extraocular Movements: Extraocular movements intact.     Conjunctiva/sclera: Conjunctivae normal.  Cardiovascular:     Rate and Rhythm: Normal rate and regular rhythm.  Pulmonary:     Effort: Pulmonary  effort is normal.     Breath sounds: Normal breath sounds.  Abdominal:     General: Abdomen is flat. Bowel sounds are decreased.     Palpations: Abdomen is soft.     Tenderness: There is no abdominal tenderness. There is no guarding or rebound. Negative signs include Murphy's sign and McBurney's sign.  Musculoskeletal:     Cervical back: Normal range of motion and neck supple.  Skin:    General: Skin is warm and dry.  Findings: No rash.  Neurological:     General: No focal deficit present.     Mental Status: She is alert and oriented to person, place, and time.     Motor: No weakness.     Coordination: Coordination normal.     Gait: Gait normal.  Psychiatric:        Mood and Affect: Mood normal.        Behavior: Behavior normal.        Thought Content: Thought content normal.      BP (!) 98/54 (BP Location: Left Arm, Patient Position: Sitting)   Pulse 65   Temp 97.9 F (36.6 C) (Temporal)   Ht 5' 8.75" (1.746 m)   Wt 121 lb (54.9 kg)   LMP 02/11/2024 (Exact Date)   SpO2 99%   BMI 18.00 kg/m  Wt Readings from Last 3 Encounters:  03/15/24 121 lb (54.9 kg)  03/07/24 124 lb (56.2 kg)  12/28/23 129 lb (58.5 kg)       Assessment & Plan:   Problem List Items Addressed This Visit   None Visit Diagnoses       Diarrhea, unspecified type    -  Primary   Relevant Orders   CBC with Differential/Platelet   Comprehensive metabolic panel with GFR   GI Profile, Stool, PCR     Abdominal cramping          Here for a 9-day course of diarrhea and generalized abdominal cramping.  Unknown trigger.  We discussed the need to stay hydrated.  She will continue probiotic.  Check labs in case of electrolyte derangement or elevated WBC in the setting of diarrhea.  GI profile ordered.  She may try over-the-counter Imodium.  Advance diet as tolerated. Follow-up pending results or sooner if any new or worsening symptoms.  We discussed red flag symptoms such as dehydration, vomiting, severe  abdominal pain or bloody stools and she is aware that she would need to go to the ED if those symptoms occur.  I am having Kristen Lane maintain her albuterol , EPINEPHrine , Prenatal Vit-Fe Fumarate-FA (PRENATAL VITAMIN PO), Fexofenadine HCl (ALLERGY 24-HR PO), clonazePAM , paragard intrauterine copper, sertraline , and fluticasone .  No orders of the defined types were placed in this encounter.

## 2024-03-15 NOTE — Patient Instructions (Signed)
 Please go downstairs for labs and to pick up the stool kit before you leave.  Stay hydrated.  Increase your nutrition/diet as tolerated but avoid spicy, heavy or fatty meals.  You can try Imodium.  Continue probiotic.  If you develop fever, vomiting, severe abdominal pain, dehydration or have bloody stool then you should go to the emergency department.  We will be in touch with your results.

## 2024-03-15 NOTE — Telephone Encounter (Signed)
 Chief Complaint: Diarrhea Symptoms: mild HA, decreased appetite Frequency: x 9 days Pertinent Negatives: Patient denies fever, N/V, blood in stool Disposition: [] ED /[] Urgent Care (no appt availability in office) / [x] Appointment(In office/virtual)/ []  [facility]/ [] Home Care/ [] Refused Recommended Disposition /[] [facility]/ []  Follow-up with PCP Additional Notes: Pt reports she began with watery diarrhea x 9 days ago accompanied by mild abd pain. Pt notes she was advised it as viral however symptoms are not improving. Pt continues with BRAT diet, reports fluid intake WNL. Denies fever, N/V, blood in stool. OV scheduled. This RN educated pt on home care, new-worsening symptoms, when to call back/seek emergent care. Pt verbalized understanding and agrees to plan.    Copied from CRM [id number]. Topic: Clinical - Red Word Triage >> Mar 15, 2024  7:45 AM [place] A wrote: [facility] that prompted transfer to Nurse Triage: Patient was seen a week ago for Viral Infection patient is not better she has diarrhea since last week Reason for Disposition  [1] MODERATE diarrhea (e.g., 4-6 times / day more than normal) AND [2] present > 48 hours (2 days)  Answer Assessment - Initial Assessment Questions 1. DIARRHEA SEVERITY: "How bad is the diarrhea?" "How many more stools have you had in the past 24 hours than normal?"    - NO DIARRHEA (SCALE 0)   - MILD (SCALE 1-3): Few loose or mushy BMs; increase of 1-3 stools over normal daily number of stools; mild increase in ostomy output.   -  MODERATE (SCALE 4-7): Increase of 4-6 stools daily over normal; moderate increase in ostomy output.   -  SEVERE (SCALE 8-10; OR "WORST POSSIBLE"): Increase of 7 or more stools daily over normal; moderate increase in ostomy output; incontinence.     5 2. ONSET: "When did the diarrhea begin?"      X 9 days 3. BM CONSISTENCY: "How loose or watery is the diarrhea?"      Watery 4. VOMITING: "Are you also  vomiting?" If Yes, ask: "How many times in the past 24 hours?"      None 5. ABDOMEN PAIN: "Are you having any abdomen pain?" If Yes, ask: "What does it feel like?" (e.g., crampy, dull, intermittent, constant)      Mild 6. ABDOMEN PAIN SEVERITY: If present, ask: "How bad is the pain?"  (e.g., Scale 1-10; mild, moderate, or severe)   - MILD (1-3): doesn't interfere with normal activities, abdomen soft and not tender to touch    - MODERATE (4-7): interferes with normal activities or awakens from sleep, abdomen tender to touch    - SEVERE (8-10): excruciating pain, doubled over, unable to do any normal activities       3/10 7. ORAL INTAKE: If vomiting, "Have you been able to drink liquids?" "How much liquids have you had in the past 24 hours?"     Yes, drinking normally, BRAT diet 8. HYDRATION: "Any signs of dehydration?" (e.g., dry mouth [not just dry lips], too weak to stand, dizziness, new weight loss) "When did you last urinate?"     None  10. ANTIBIOTIC USE: "Are you taking antibiotics now or have you taken antibiotics in the past 2 months?"       Pt was on abx course for bronchitis at beginning of May 11. OTHER SYMPTOMS: "Do you have any other symptoms?" (e.g., fever, blood in stool)       None  Protocols used: Diarrhea-A-AH

## 2024-03-16 ENCOUNTER — Other Ambulatory Visit: Payer: Self-pay | Admitting: Family Medicine

## 2024-03-16 DIAGNOSIS — A0472 Enterocolitis due to Clostridium difficile, not specified as recurrent: Secondary | ICD-10-CM

## 2024-03-16 LAB — GI PROFILE, STOOL, PCR

## 2024-03-16 MED ORDER — VANCOMYCIN HCL 125 MG PO CAPS
125.0000 mg | ORAL_CAPSULE | Freq: Four times a day (QID) | ORAL | 0 refills | Status: AC
Start: 2024-03-16 — End: 2024-03-26

## 2024-03-16 NOTE — Progress Notes (Signed)
 Please call her and let her know that her stool test is positive for a bacteria called C- difficile.   This is most likely due to her recent antibiotic use but it can also be contracted in the community.  It is highly contagious.  Alcohol gel does not kill the bacteria, you have to wash your hands really well and get it off that way.  I will send in an antibiotic to treat this infection.  Her symptoms should improve over the next week or 2.  Follow-up if worsening or no improvement is noticed in the next 2 weeks.

## 2024-03-17 NOTE — Telephone Encounter (Signed)
 Copied from CRM [id number]. Topic: Clinical - Lab/Test Results >> Mar 17, 2024  7:39 AM Alyse July wrote: Reason for CRM: Patient returned call pertaining to recent lab results. Patient will be available anytime before 10:00am. Contact number confirmed: [phone].

## 2024-03-31 ENCOUNTER — Encounter: Payer: Self-pay | Admitting: Family Medicine

## 2024-03-31 NOTE — Telephone Encounter (Signed)
 Copied from CRM [id number]. Topic: Clinical - Medical Advice >> Mar 31, 2024 11:20 AM Dewanda Foots wrote: Reason for CRM: Pt called in stating she was dx with CDiff-and was on antibiotics for 10 days and finished Sunday 03/27/24.  Pt states she began having body aches last night and they continued to today as well as having 2-3 bouts of diarrhea today.  She is concerned the C-Diff  is not cleared up and wants to know if another stool sample is needed to see if the infection is still there and what next steps to take would be.  Patient callback number is [phone] if she does not answer, please leave detailed message or send patient message through MyChart as well (she is at work).

## 2024-04-01 ENCOUNTER — Ambulatory Visit: Payer: Self-pay | Admitting: *Deleted

## 2024-04-01 NOTE — Telephone Encounter (Signed)
 FYI Only or Action Required?: Action required by provider  Patient was last seen in primary care on 03/15/2024 by Alyson Back L, NP-C. Called Nurse Triage reporting Diarrhea. Symptoms began yesterday. Interventions attempted: OTC medications: water gatorade and Prescription medications: completed course of vancomycin  for c diff. Symptoms are: unchanged.  Triage Disposition: See Physician Within 24 Hours  Patient/caregiver understands and will follow disposition?: Yes                Copied from CRM [id number]. Topic: Clinical - Red Word Triage >> Apr 01, 2024  8:17 AM Kristen Lane wrote: [facility] that prompted transfer to Nurse Triage: C-diff symptoms coming back. Patient experiencing diarrhea, fever, chills, body aches, and fatigue. Symptoms came back on Wednesday night. Reason for Disposition  [1] MODERATE diarrhea (Lane.g., 4-6 times / day more than normal) AND [2] present > 48 hours (2 days)  Answer Assessment - Initial Assessment Questions 1. DIARRHEA SEVERITY: How bad is the diarrhea? How many more stools have you had in the past 24 hours than normal?    - NO DIARRHEA (SCALE 0)   - MILD (SCALE 1-3): Few loose or mushy BMs; increase of 1-3 stools over normal daily number of stools; mild increase in ostomy output.   -  MODERATE (SCALE 4-7): Increase of 4-6 stools daily over normal; moderate increase in ostomy output.   -  SEVERE (SCALE 8-10; OR WORST POSSIBLE): Increase of 7 or more stools daily over normal; moderate increase in ostomy output; incontinence.     4-5 times  2. ONSET: When did the diarrhea begin?      On 10 days of vancomyocin  3. BM CONSISTENCY: How loose or watery is the diarrhea?      watery 4. VOMITING: Are you also vomiting? If Yes, ask: How many times in the past 24 hours?      na 5. ABDOMEN PAIN: Are you having any abdomen pain? If Yes, ask: What does it feel like? (Lane.g., crampy, dull, intermittent, constant)      Crampy, bloated and  achy  6. ABDOMEN PAIN SEVERITY: If present, ask: How bad is the pain?  (Lane.g., Scale 1-10; mild, moderate, or severe)   - MILD (1-3): doesn't interfere with normal activities, abdomen soft and not tender to touch    - MODERATE (4-7): interferes with normal activities or awakens from sleep, abdomen tender to touch    - SEVERE (8-10): excruciating pain, doubled over, unable to do any normal activities       Body aches mild to moderate abdominal pain 7. ORAL INTAKE: If vomiting, Have you been able to drink liquids? How much liquids have you had in the past 24 hours?     Yes  8. HYDRATION: Any signs of dehydration? (Lane.g., dry mouth [not just dry lips], too weak to stand, dizziness, new weight loss) When did you last urinate?     Weakness  9. EXPOSURE: Have you traveled to a foreign country recently? Have you been exposed to anyone with diarrhea? Could you have eaten any food that was spoiled?     Na. Recent treatment for c diff 10. ANTIBIOTIC USE: Are you taking antibiotics now or have you taken antibiotics in the past 2 months?       Yes just finished 10 day course of vanco for c diff treatment  11. OTHER SYMPTOMS: Do you have any other symptoms? (Lane.g., fever, blood in stool)       Body aches , fever at  times none now. Chills. Diarrhea 4-5 times x 24 hours. 12. PREGNANCY: Is there any chance you are pregnant? When was your last menstrual period?       Na   Patient went to UC yesterday and treated for dehydration. Referred to GI. Paitent reports no available appt until end of July. Patient requesting to see PCP . No available appt with PCP. Scheduled VV for 04/04/24 with other provider per patient request. Please advise any other treatment plan for patient at this time. Recommended gatorade and introduce small amounts of food gradually. Go to ED if sx of dizziness dehydration return. Please advise if in person appt should be scheduled instead of VV.  Protocols used:  [facility]

## 2024-04-03 ENCOUNTER — Encounter (HOSPITAL_COMMUNITY): Payer: Self-pay

## 2024-04-03 ENCOUNTER — Other Ambulatory Visit: Payer: Self-pay

## 2024-04-03 ENCOUNTER — Inpatient Hospital Stay (HOSPITAL_COMMUNITY)
Admission: EM | Admit: 2024-04-03 | Discharge: 2024-04-07 | DRG: 872 | Disposition: A | Discharge status: Home / Self Care | Attending: Family Medicine | Admitting: Family Medicine

## 2024-04-03 ENCOUNTER — Emergency Department (HOSPITAL_COMMUNITY)

## 2024-04-03 DIAGNOSIS — R000 Tachycardia, unspecified: Secondary | ICD-10-CM | POA: Diagnosis present

## 2024-04-03 DIAGNOSIS — Z803 Family history of malignant neoplasm of breast: Secondary | ICD-10-CM | POA: Diagnosis not present

## 2024-04-03 DIAGNOSIS — E876 Hypokalemia: Secondary | ICD-10-CM | POA: Diagnosis present

## 2024-04-03 DIAGNOSIS — E871 Hypo-osmolality and hyponatremia: Secondary | ICD-10-CM | POA: Diagnosis present

## 2024-04-03 DIAGNOSIS — R112 Nausea with vomiting, unspecified: Secondary | ICD-10-CM | POA: Diagnosis present

## 2024-04-03 DIAGNOSIS — J45909 Unspecified asthma, uncomplicated: Secondary | ICD-10-CM | POA: Diagnosis present

## 2024-04-03 DIAGNOSIS — D649 Anemia, unspecified: Secondary | ICD-10-CM | POA: Diagnosis present

## 2024-04-03 DIAGNOSIS — Z681 Body mass index (BMI) 19 or less, adult: Secondary | ICD-10-CM

## 2024-04-03 DIAGNOSIS — E119 Type 2 diabetes mellitus without complications: Secondary | ICD-10-CM | POA: Diagnosis present

## 2024-04-03 DIAGNOSIS — E872 Acidosis, unspecified: Secondary | ICD-10-CM | POA: Diagnosis present

## 2024-04-03 DIAGNOSIS — E860 Dehydration: Secondary | ICD-10-CM | POA: Diagnosis present

## 2024-04-03 DIAGNOSIS — Z975 Presence of (intrauterine) contraceptive device: Secondary | ICD-10-CM

## 2024-04-03 DIAGNOSIS — Z79899 Other long term (current) drug therapy: Secondary | ICD-10-CM

## 2024-04-03 DIAGNOSIS — A0471 Enterocolitis due to Clostridium difficile, recurrent: Principal | ICD-10-CM | POA: Diagnosis present

## 2024-04-03 DIAGNOSIS — Z8042 Family history of malignant neoplasm of prostate: Secondary | ICD-10-CM

## 2024-04-03 DIAGNOSIS — F419 Anxiety disorder, unspecified: Secondary | ICD-10-CM | POA: Diagnosis present

## 2024-04-03 DIAGNOSIS — A414 Sepsis due to anaerobes: Principal | ICD-10-CM | POA: Diagnosis present

## 2024-04-03 DIAGNOSIS — R197 Diarrhea, unspecified: Secondary | ICD-10-CM | POA: Diagnosis not present

## 2024-04-03 DIAGNOSIS — R636 Underweight: Secondary | ICD-10-CM | POA: Diagnosis present

## 2024-04-03 DIAGNOSIS — Z825 Family history of asthma and other chronic lower respiratory diseases: Secondary | ICD-10-CM

## 2024-04-03 LAB — HCG, SERUM, QUALITATIVE: Preg, Serum: NEGATIVE

## 2024-04-03 LAB — ETHANOL: Alcohol, Ethyl (B): <15 mg/dL (ref <15)

## 2024-04-03 LAB — COMPREHENSIVE METABOLIC PANEL WITH GFR
ALT: 13 U/L (ref 0–44)
AST: 16 U/L (ref 15–41)
Albumin: 3.3 g/dL — ABNORMAL LOW (ref 3.5–5.0)
Alkaline Phosphatase: 47 U/L (ref 38–126)
Anion gap: 12 (ref 5–15)
BUN: 6 mg/dL (ref 6–20)
CO2: 20 mmol/L — ABNORMAL LOW (ref 22–32)
Calcium: 9.1 mg/dL (ref 8.9–10.3)
Chloride: 104 mmol/L (ref 98–111)
Creatinine, Ser: 0.75 mg/dL (ref 0.44–1.00)
GFR, Estimated: >60 mL/min (ref >60)
Glucose, Bld: 109 mg/dL — ABNORMAL HIGH (ref 70–99)
Potassium: 3.7 mmol/L (ref 3.5–5.1)
Sodium: 136 mmol/L (ref 135–145)
Total Bilirubin: 0.6 mg/dL (ref 0.0–1.2)
Total Protein: 7 g/dL (ref 6.5–8.1)

## 2024-04-03 LAB — URINALYSIS, ROUTINE W REFLEX MICROSCOPIC
Bilirubin Urine: NEGATIVE
Glucose, UA: NEGATIVE mg/dL
Ketones, ur: NEGATIVE mg/dL
Leukocytes,Ua: NEGATIVE
Nitrite: NEGATIVE
Protein, ur: NEGATIVE mg/dL
Specific Gravity, Urine: 1.016 (ref 1.005–1.030)
pH: 5 (ref 5.0–8.0)

## 2024-04-03 LAB — CBC
HCT: 36.6 % (ref 36.0–46.0)
HCT: 28.8 % — ABNORMAL LOW (ref 36.0–46.0)
Hemoglobin: 12 g/dL (ref 12.0–15.0)
Hemoglobin: 9.6 g/dL — ABNORMAL LOW (ref 12.0–15.0)
MCH: 28.2 pg (ref 26.0–34.0)
MCH: 28.4 pg (ref 26.0–34.0)
MCHC: 32.8 g/dL (ref 30.0–36.0)
MCHC: 33.3 g/dL (ref 30.0–36.0)
MCV: 85.9 fL (ref 80.0–100.0)
MCV: 85.2 fL (ref 80.0–100.0)
Platelets: 250 10*3/uL (ref 150–400)
Platelets: 174 10*3/uL (ref 150–400)
RBC: 4.26 MIL/uL (ref 3.87–5.11)
RBC: 3.38 MIL/uL — ABNORMAL LOW (ref 3.87–5.11)
RDW: 13.6 % (ref 11.5–15.5)
RDW: 13.7 % (ref 11.5–15.5)
WBC: 4.7 10*3/uL (ref 4.0–10.5)
WBC: 3.1 10*3/uL — ABNORMAL LOW (ref 4.0–10.5)
nRBC: 0 % (ref 0.0–0.2)
nRBC: 0 % (ref 0.0–0.2)

## 2024-04-03 LAB — LACTIC ACID, PLASMA
Lactic Acid, Venous: 1.2 mmol/L (ref 0.5–1.9)
Lactic Acid, Venous: 1.3 mmol/L (ref 0.5–1.9)
Lactic Acid, Venous: 2.3 mmol/L (ref 0.5–1.9)

## 2024-04-03 LAB — LIPASE, BLOOD: Lipase: 21 U/L (ref 11–51)

## 2024-04-03 LAB — MAGNESIUM: Magnesium: 2 mg/dL (ref 1.7–2.4)

## 2024-04-03 LAB — C DIFFICILE QUICK SCREEN W PCR REFLEX
C Diff antigen: POSITIVE — AB
C Diff interpretation: DETECTED
C Diff toxin: POSITIVE — AB

## 2024-04-03 MED ORDER — FAMOTIDINE IN NACL 20-0.9 MG/50ML-% IV SOLN
20.0000 mg | Freq: Two times a day (BID) | INTRAVENOUS | Status: DC
  Administered 2024-04-03 – 2024-04-07 (×8): 20 mg via INTRAVENOUS
  Filled 2024-04-03 (×12): qty 50

## 2024-04-03 MED ORDER — ACETAMINOPHEN 650 MG RE SUPP: 650.0000 mg | Freq: Four times a day (QID) | RECTAL | Status: DC | PRN

## 2024-04-03 MED ORDER — SODIUM CHLORIDE 0.9% FLUSH
3.0000 mL | Freq: Two times a day (BID) | INTRAVENOUS | Status: DC
  Administered 2024-04-03 – 2024-04-07 (×7): 3 mL via INTRAVENOUS

## 2024-04-03 MED ORDER — KETOROLAC TROMETHAMINE 30 MG/ML IJ SOLN
30.0000 mg | Freq: Once | INTRAMUSCULAR | Status: AC
  Administered 2024-04-03: 30 mg via INTRAVENOUS
  Filled 2024-04-03: qty 1

## 2024-04-03 MED ORDER — LACTATED RINGERS IV SOLN: INTRAVENOUS | Status: AC

## 2024-04-03 MED ORDER — PANTOPRAZOLE SODIUM 40 MG IV SOLR
40.0000 mg | Freq: Two times a day (BID) | INTRAVENOUS | Status: DC
  Filled 2024-04-03: qty 10

## 2024-04-03 MED ORDER — ONDANSETRON HCL 4 MG/2ML IJ SOLN
4.0000 mg | Freq: Once | INTRAMUSCULAR | Status: AC
  Administered 2024-04-03: 4 mg via INTRAVENOUS
  Filled 2024-04-03: qty 2

## 2024-04-03 MED ORDER — ACETAMINOPHEN 325 MG PO TABS
650.0000 mg | ORAL_TABLET | Freq: Four times a day (QID) | ORAL | Status: DC | PRN
Start: 2024-04-03 — End: 2024-04-07
  Administered 2024-04-03 – 2024-04-04 (×3): 650 mg via ORAL
  Filled 2024-04-03 (×3): qty 2

## 2024-04-03 MED ORDER — HEPARIN SODIUM (PORCINE) 5000 UNIT/ML IJ SOLN
5000.0000 [IU] | Freq: Two times a day (BID) | INTRAMUSCULAR | Status: DC
  Administered 2024-04-04 – 2024-04-07 (×7): 5000 [IU] via SUBCUTANEOUS
  Filled 2024-04-03 (×8): qty 1

## 2024-04-03 MED ORDER — KETOROLAC TROMETHAMINE 15 MG/ML IJ SOLN
15.0000 mg | Freq: Once | INTRAMUSCULAR | Status: AC
  Administered 2024-04-03: 15 mg via INTRAVENOUS
  Filled 2024-04-03: qty 1

## 2024-04-03 MED ORDER — VANCOMYCIN HCL 125 MG PO CAPS
125.0000 mg | ORAL_CAPSULE | Freq: Four times a day (QID) | ORAL | Status: AC
Start: 2024-04-03 — End: 2024-04-13
  Administered 2024-04-03 – 2024-04-04 (×5): 125 mg via ORAL
  Filled 2024-04-03 (×8): qty 1

## 2024-04-03 MED ORDER — IOHEXOL 350 MG/ML SOLN
75.0000 mL | Freq: Once | INTRAVENOUS | Status: AC | PRN
  Administered 2024-04-03: 75 mL via INTRAVENOUS

## 2024-04-03 MED ORDER — SODIUM CHLORIDE 0.9 % IV BOLUS
2000.0000 mL | Freq: Once | INTRAVENOUS | Status: AC
  Administered 2024-04-03: 1000 mL via INTRAVENOUS

## 2024-04-03 MED ORDER — LACTATED RINGERS IV BOLUS
500.0000 mL | Freq: Once | INTRAVENOUS | Status: AC
  Administered 2024-04-03: 500 mL via INTRAVENOUS

## 2024-04-03 NOTE — ED Provider Notes (Signed)
 Emergency Department Provider Note   I have reviewed the triage vital signs and the nursing notes.   HISTORY  Chief Complaint Emesis   HPI Kristen Lane is a 38 y.o. female with a recent diagnosis of C. difficile completing 1 course of oral vancomycin  at home presents to the emergency department with return of diarrhea symptoms, body aches, cramping abdominal pain.  She states that after her course ended 1 week ago she is feeling better for 1 to 2 days and then diarrhea slowly returned.  No bloody diarrhea or fever.  She began having vomiting this morning and contacted the PCP who recommended she go to the emergency department.  No pain in the chest or trouble breathing.  C. difficile thought to begin after course of antibiotics for bronchitis.   Past Medical History:  Diagnosis Date   Allergy    Anemia    Anxiety    Asthma    Encounter for general adult medical examination with abnormal findings 11/11/2023    Review of Systems  Constitutional: No fever/chills Cardiovascular: Denies chest pain. Respiratory: Denies shortness of breath. Gastrointestinal: Positive abdominal pain. Positive vomiting. Positive diarrhea.  Skin: Negative for rash. Neurological: Negative for headaches.  ____________________________________________   PHYSICAL EXAM:  VITAL SIGNS: ED Triage Vitals  Encounter Vitals Group     BP 04/03/24 1113 118/77     Pulse Rate 04/03/24 1113 (!) 134     Resp 04/03/24 1113 16     Temp 04/03/24 1113 98.9 F (37.2 C)     Temp Source 04/03/24 1113 Oral     SpO2 04/03/24 1113 98 %     Weight 04/03/24 1111 122 lb (55.3 kg)     Height 04/03/24 1111 5' 9 (1.753 m)   Constitutional: Alert and oriented. Well appearing and in no acute distress. Eyes: Conjunctivae are normal. Head: Atraumatic. Nose: No congestion/rhinnorhea. Mouth/Throat: Mucous membranes are moist.   Neck: No stridor.  Cardiovascular: Normal rate, regular rhythm. Good peripheral  circulation. Grossly normal heart sounds.   Respiratory: Normal respiratory effort.  No retractions. Lungs CTAB. Gastrointestinal: Soft and nontender. No distention.  Musculoskeletal: No gross deformities of extremities. Neurologic:  Normal speech and language.  Skin:  Skin is warm, dry and intact. No rash noted.  ____________________________________________   LABS (all labs ordered are listed, but only abnormal results are displayed)  Labs Reviewed  C DIFFICILE QUICK SCREEN W PCR REFLEX   - Abnormal; Notable for the following components:      Result Value   C Diff antigen POSITIVE (*)    C Diff toxin POSITIVE (*)    All other components within normal limits  COMPREHENSIVE METABOLIC PANEL WITH GFR - Abnormal; Notable for the following components:   CO2 20 (*)    Glucose, Bld 109 (*)    Albumin 3.3 (*)    All other components within normal limits  URINALYSIS, ROUTINE W REFLEX MICROSCOPIC - Abnormal; Notable for the following components:   APPearance HAZY (*)    Hgb urine dipstick SMALL (*)    Bacteria, UA RARE (*)    All other components within normal limits  GASTROINTESTINAL PANEL BY PCR, STOOL (REPLACES STOOL CULTURE)  LIPASE, BLOOD  CBC  HCG, SERUM, QUALITATIVE  MAGNESIUM  ETHANOL  LACTIC ACID, PLASMA  LACTIC ACID, PLASMA  RAPID URINE DRUG SCREEN, HOSP PERFORMED  LACTOFERRIN, FECAL, QUALITATIVE  CBC   ____________________________________________  RADIOLOGY  CT ABDOMEN PELVIS W CONTRAST Result Date: 04/03/2024 CLINICAL DATA:  Abdominal pain,  acute, nonlocalized EXAM: CT ABDOMEN AND PELVIS WITH CONTRAST TECHNIQUE: Multidetector CT imaging of the abdomen and pelvis was performed using the standard protocol following bolus administration of intravenous contrast. RADIATION DOSE REDUCTION: This exam was performed according to the departmental dose-optimization program which includes automated exposure control, adjustment of the mA and/or kV according to patient size and/or  use of iterative reconstruction technique. CONTRAST:  75mL OMNIPAQUE IOHEXOL 350 MG/ML SOLN COMPARISON:  None Available. FINDINGS: Lower chest: No acute abnormality. Hepatobiliary: No focal liver abnormality. No gallstones, gallbladder wall thickening, or pericholecystic fluid. No biliary dilatation. Pancreas: No focal lesion. Normal pancreatic contour. No surrounding inflammatory changes. No main pancreatic ductal dilatation. Spleen: Normal in size without focal abnormality. Adrenals/Urinary Tract: No adrenal nodule bilaterally. Bilateral kidneys enhance symmetrically. No hydronephrosis. No hydroureter. The urinary bladder is unremarkable. Stomach/Bowel: Stomach is within normal limits. No evidence of bowel wall thickening or dilatation. Stool throughout the majority of the colon. No pericolonic fat stranding. Lack up haustra of the descending and sigmoid colon-may be due to chronic inflammatory changes such as in ulcer colitis. Appendix appears normal. Vascular/Lymphatic: No abdominal aorta or iliac aneurysm. No abdominal, pelvic, or inguinal lymphadenopathy. Reproductive: T-shaped intrauterine device in appropriate position. Uterus and bilateral adnexa are unremarkable. Corpus luteum cyst within the left ovary-no further follow-up indicated. Other: Small volume simple free fluid. No intraperitoneal free gas. No organized fluid collection. Musculoskeletal: No abdominal wall hernia or abnormality. No suspicious lytic or blastic osseous lesions. No acute displaced fracture. IMPRESSION: 1. No acute intra-abdominal or intrapelvic abnormality. 2. T-shaped intrauterine device in appropriate position. Electronically Signed   By: Morgane  Naveau M.D.   On: 04/03/2024 14:08    ____________________________________________   PROCEDURES  Procedure(s) performed:   Procedures  None  ____________________________________________   INITIAL IMPRESSION / ASSESSMENT AND PLAN / ED COURSE  Pertinent labs & imaging  results that were available during my care of the patient were reviewed by me and considered in my medical decision making (see chart for details).   This patient is Presenting for Evaluation of abdominal pain, which does require a range of treatment options, and is a complaint that involves a high risk of morbidity and mortality.  The Differential Diagnoses includes but is not exclusive to acute cholecystitis, intrathoracic causes for epigastric abdominal pain, gastritis, duodenitis, pancreatitis, small bowel or large bowel obstruction, abdominal aortic aneurysm, hernia, gastritis, etc.   Critical Interventions-    Medications  vancomycin  (VANCOCIN ) capsule 125 mg (125 mg Oral Given 04/03/24 1619)  lactated ringers infusion ( Intravenous New Bag/Given 04/03/24 1600)  famotidine  (PEPCID ) IVPB 20 mg premix (has no administration in time range)  heparin injection 5,000 Units (has no administration in time range)  sodium chloride  flush (NS) 0.9 % injection 3 mL (has no administration in time range)  acetaminophen (TYLENOL) tablet 650 mg (has no administration in time range)    Or  acetaminophen (TYLENOL) suppository 650 mg (has no administration in time range)  sodium chloride  0.9 % bolus 2,000 mL (0 mLs Intravenous Stopped 04/03/24 1501)  ondansetron (ZOFRAN) injection 4 mg (4 mg Intravenous Given 04/03/24 1218)  ketorolac (TORADOL) 30 MG/ML injection 30 mg (30 mg Intravenous Given 04/03/24 1333)  iohexol (OMNIPAQUE) 350 MG/ML injection 75 mL (75 mLs Intravenous Contrast Given 04/03/24 1356)    Reassessment after intervention:  symptoms minimally improved.    I did obtain Additional Historical Information from family at bedside.   Clinical Laboratory Tests Ordered, included no leukocytosis.  No acute kidney injury.  Potassium and magnesium are normal.  C. difficile remains positive.  Radiologic Tests: Considered CT abd/pelvis but patient has no focal tenderness, rebound, or guarding.    Cardiac Monitor Tracing which shows NSR.    Social Determinants of Health Risk patient is a non-smoker.   Consult complete with ID, Dr. Artemio Larry. Likely c diff recurrence. Plan to re-start PO vancomycin  125 mg PO 4 x daily for 10 days. PCP to decide on need for taper.   TRH, Dr. Lydia Sams. Plan for admit.   Medical Decision Making: Summary:  The patient presents the emergency department with return of diarrhea but now vomiting this morning.  No focal tenderness or peritonitis on exam.  Plan for screening blood work, 2 L IV fluid, Zofran.  Will send a C. difficile test to decide on repeat Vancomycin  dosing.   Reevaluation with update and discussion with patient.  She remains tachycardic with abdominal cramping and nausea.  She has concerns about maintaining p.o. hydration and taking oral antibiotic at home.  Discussed observation stay for continued IV fluids.  She is in agreement.  Patient's presentation is most consistent with acute presentation with potential threat to life or bodily function.   Disposition: admit  ____________________________________________  FINAL CLINICAL IMPRESSION(S) / ED DIAGNOSES  Final diagnoses:  Recurrent Clostridioides difficile diarrhea  Dehydration    Note:  This document was prepared using Dragon voice recognition software and may include unintentional dictation errors.  Abby Hocking, MD, [facility] Emergency Medicine    Jaquil Todt, Shereen Dike, MD 04/03/24 [phone]

## 2024-04-03 NOTE — ED Notes (Signed)
 Enteric precaution sign placed on door. PPE cart placed outside of pt room.

## 2024-04-03 NOTE — ED Notes (Addendum)
 Transport enroute to move patient to IP bed.

## 2024-04-03 NOTE — H&P (Signed)
 History and Physical    Patient: Kristen Lane FAO:[id number] DOB: [birth year] DOA: 04/03/2024 DOS: the patient was seen and examined on 04/03/2024 PCP: Abram Abraham, NP-C  Patient coming from: Home Chief complaint: Chief Complaint  Patient presents with   Emesis   HPI:  Kristen Lane is a 38 y.o. female with past medical history  of  Allergy, history of anemia, anxiety, asthma, C. difficile recently treated with p.o. vancomycin  course comes with vomiting that started today.  Patient was not being recurrence of diarrhea body aches generalized weakness chills night sweats that has been going on for the past 4 days.  EDMD discussed case with ID provider who recommended admission for C. difficile recurrence. ED Course: Pt in ed at bedside  is alert awake oriented afebrile initially tachycardic that resolved with IV fluid and hydration.  Patient is thin and underweight with a BMI of 18.02. Vital signs in the ED were notable for the following:  Vitals:   04/03/24 1111 04/03/24 1113 04/03/24 1249 04/03/24 1504  BP:  118/77 116/87   Pulse:  (!) 134 80   Temp:  98.9 F (37.2 C)  98.9 F (37.2 C)  Resp:  16 18   Height: 5' 9 (1.753 m)     Weight: 55.3 kg     SpO2:  98% 100%   TempSrc:  Oral    BMI (Calculated): 18.01     >>ED evaluation thus far shows: -CMP today shows bicarb of 20 glucose 109 albumin 3.3 otherwise electrolytes kidney function and LFTs are within normal limits. -CBC is within normal limits. - Urine pregnancy test negative and urinalysis is is within normal limits. - Alcohol level magnesium  ordered and pending.  -Stool study is positive for C. difficile antigen and toxin today. -CT of the abdomen and pelvis with contrast is negative for any acute findings.   >>While in the ED patient received the following: Medications  vancomycin  (VANCOCIN ) capsule 125 mg (has no administration in time range)  lactated ringers infusion (has no administration in time range)   pantoprazole (PROTONIX) injection 40 mg (has no administration in time range)  sodium chloride  0.9 % bolus 2,000 mL (0 mLs Intravenous Stopped 04/03/24 1501)  ondansetron (ZOFRAN) injection 4 mg (4 mg Intravenous Given 04/03/24 1218)  ketorolac (TORADOL) 30 MG/ML injection 30 mg (30 mg Intravenous Given 04/03/24 1333)  iohexol (OMNIPAQUE) 350 MG/ML injection 75 mL (75 mLs Intravenous Contrast Given 04/03/24 1356)   Review of Systems  Constitutional:  Positive for chills and fever.  Gastrointestinal:  Positive for abdominal pain, diarrhea, nausea and vomiting.  Neurological:  Positive for weakness.   Past Medical History:  Diagnosis Date   Allergy    Anemia    Anxiety    Asthma    Encounter for general adult medical examination with abnormal findings 11/11/2023   Past Surgical History:  Procedure Laterality Date   WISDOM TOOTH EXTRACTION      reports that she has never smoked. She has never used smokeless tobacco. She reports current alcohol use of about 3.0 standard drinks of alcohol per week. She reports that she does not use drugs. No Known Allergies Family History  Problem Relation Age of Onset   Prostate cancer Father    Hyperparathyroidism Sister    Breast cancer Maternal Grandmother    Cancer Maternal Grandmother    Emphysema Maternal Grandfather    Breast cancer Paternal Grandmother    Cancer Paternal Grandmother    Prior to Admission medications  Medication Sig Start Date End Date Taking? Authorizing Provider  ascorbic acid (VITAMIN C) 500 MG tablet Take 500 mg by mouth daily.   Yes [provider]  cetirizine (ZYRTEC) 10 MG tablet Take 10 mg by mouth daily.   Yes [provider]  Ferrous Sulfate (IRON PO) Take 1 tablet by mouth daily at 12 noon.   Yes [provider]  Multiple Vitamins-Minerals (ZINC PO) Take 1 tablet by mouth daily.   Yes [provider]  paragard intrauterine copper IUD IUD 1 Device by Intrauterine route.   Yes  [provider]  Prenatal Vit-Fe Fumarate-FA (PRENATAL VITAMIN PO) Take 1 tablet by mouth daily at 12 noon.   Yes [provider]  sertraline  (ZOLOFT ) 100 MG tablet Take 100 mg by mouth daily.   Yes [provider]  Turmeric (QC TUMERIC COMPLEX PO) Take 1 capsule by mouth daily at 12 noon.   Yes [provider]  Vitamin D-Vitamin K (VITAMIN K2-VITAMIN D3 PO) Take 1 tablet by mouth daily at 12 noon.   Yes [provider]                                                                                 Vitals:   04/03/24 1111 04/03/24 1113 04/03/24 1249 04/03/24 1504  BP:  118/77 116/87   Pulse:  (!) 134 80   Resp:  16 18   Temp:  98.9 F (37.2 C)  98.9 F (37.2 C)  TempSrc:  Oral    SpO2:  98% 100%   Weight: 55.3 kg     Height: 5' 9 (1.753 m)      Physical Exam Vitals and nursing note reviewed.  Constitutional:      General: She is not in acute distress. HENT:     Head: Normocephalic and atraumatic.     Right Ear: Hearing normal.     Left Ear: Hearing normal.     Nose: No nasal deformity.     Mouth/Throat:     Lips: Pink.     Mouth: Mucous membranes are dry.   Eyes:     General: Lids are normal.     Extraocular Movements: Extraocular movements intact.    Cardiovascular:     Rate and Rhythm: Normal rate and regular rhythm.     Heart sounds: Normal heart sounds.  Pulmonary:     Effort: Pulmonary effort is normal.     Breath sounds: Normal breath sounds.  Abdominal:     General: Bowel sounds are normal. There is no distension.     Palpations: Abdomen is soft. There is no mass.     Tenderness: There is no abdominal tenderness.   Musculoskeletal:     Right lower leg: No edema.     Left lower leg: No edema.   Skin:    General: Skin is warm.   Neurological:     General: No focal deficit present.     Mental Status: She is alert and oriented to person, place, and time.     Cranial Nerves: Cranial nerves 2-12 are intact.    Psychiatric:        Speech: Speech normal.  Labs on Admission: I have personally reviewed following labs and imaging studies CBC: Recent Labs  Lab 04/03/24 1119  WBC 4.7  HGB 12.0  HCT 36.6  MCV 85.9  PLT 250   Basic Metabolic Panel: Recent Labs  Lab 04/03/24 1119  NA 136  K 3.7  CL 104  CO2 20*  GLUCOSE 109*  BUN 6  CREATININE 0.75  CALCIUM 9.1   GFR: Estimated Creatinine Clearance: 84.1 mL/min (by C-G formula based on SCr of 0.75 mg/dL). Liver Function Tests: Recent Labs  Lab 04/03/24 1119  AST 16  ALT 13  ALKPHOS 47  BILITOT 0.6  PROT 7.0  ALBUMIN 3.3*   Recent Labs  Lab 04/03/24 1119  LIPASE 21   No results for input(s): AMMONIA in the last 168 hours. Coagulation Profile: No results for input(s): INR, PROTIME in the last 168 hours. Cardiac Enzymes: No results for input(s): CKTOTAL, CKMB, CKMBINDEX, TROPONINI in the last 168 hours. BNP (last 3 results) No results for input(s): PROBNP in the last 8760 hours. HbA1C: No results for input(s): HGBA1C in the last 72 hours. CBG: No results for input(s): GLUCAP in the last 168 hours. Lipid Profile: No results for input(s): CHOL, HDL, LDLCALC, TRIG, CHOLHDL, LDLDIRECT in the last 72 hours. Thyroid  Function Tests: No results for input(s): TSH, T4TOTAL, FREET4, T3FREE, THYROIDAB in the last 72 hours. Anemia Panel: No results for input(s): VITAMINB12, FOLATE, FERRITIN, TIBC, IRON, RETICCTPCT in the last 72 hours. Urine analysis:    Component Value Date/Time   COLORURINE YELLOW 04/03/2024 1113   APPEARANCEUR HAZY (A) 04/03/2024 1113   LABSPEC 1.016 04/03/2024 1113   PHURINE 5.0 04/03/2024 1113   GLUCOSEU NEGATIVE 04/03/2024 1113   HGBUR SMALL (A) 04/03/2024 1113   BILIRUBINUR NEGATIVE 04/03/2024 1113   BILIRUBINUR negative 01/28/2018 1504   KETONESUR NEGATIVE 04/03/2024 1113   PROTEINUR NEGATIVE 04/03/2024 1113   UROBILINOGEN 0.2 01/28/2018  1504   NITRITE NEGATIVE 04/03/2024 1113   LEUKOCYTESUR NEGATIVE 04/03/2024 1113   Radiological Exams on Admission: CT ABDOMEN PELVIS W CONTRAST Result Date: 04/03/2024 CLINICAL DATA:  Abdominal pain, acute, nonlocalized EXAM: CT ABDOMEN AND PELVIS WITH CONTRAST TECHNIQUE: Multidetector CT imaging of the abdomen and pelvis was performed using the standard protocol following bolus administration of intravenous contrast. RADIATION DOSE REDUCTION: This exam was performed according to the departmental dose-optimization program which includes automated exposure control, adjustment of the mA and/or kV according to patient size and/or use of iterative reconstruction technique. CONTRAST:  75mL OMNIPAQUE IOHEXOL 350 MG/ML SOLN COMPARISON:  None Available. FINDINGS: Lower chest: No acute abnormality. Hepatobiliary: No focal liver abnormality. No gallstones, gallbladder wall thickening, or pericholecystic fluid. No biliary dilatation. Pancreas: No focal lesion. Normal pancreatic contour. No surrounding inflammatory changes. No main pancreatic ductal dilatation. Spleen: Normal in size without focal abnormality. Adrenals/Urinary Tract: No adrenal nodule bilaterally. Bilateral kidneys enhance symmetrically. No hydronephrosis. No hydroureter. The urinary bladder is unremarkable. Stomach/Bowel: Stomach is within normal limits. No evidence of bowel wall thickening or dilatation. Stool throughout the majority of the colon. No pericolonic fat stranding. Lack up haustra of the descending and sigmoid colon-may be due to chronic inflammatory changes such as in ulcer colitis. Appendix appears normal. Vascular/Lymphatic: No abdominal aorta or iliac aneurysm. No abdominal, pelvic, or inguinal lymphadenopathy. Reproductive: T-shaped intrauterine device in appropriate position. Uterus and bilateral adnexa are unremarkable. Corpus luteum cyst within the left ovary-no further follow-up indicated. Other: Small volume simple free fluid. No  intraperitoneal free gas. No organized fluid collection. Musculoskeletal: No  abdominal wall hernia or abnormality. No suspicious lytic or blastic osseous lesions. No acute displaced fracture. IMPRESSION: 1. No acute intra-abdominal or intrapelvic abnormality. 2. T-shaped intrauterine device in appropriate position. Electronically Signed   By: Morgane  Naveau M.D.   On: 04/03/2024 14:08   Data Reviewed: Relevant notes from primary care and specialist visits, past discharge summaries as available in EHR, including Care Everywhere . Prior diagnostic testing as pertinent to current admission diagnoses, Updated medications and problem lists for reconciliation .ED course, including vitals, labs, imaging, treatment and response to treatment,Triage notes, nursing and pharmacy notes and ED provider's notes.Notable results as noted in HPI.Discussed case with EDMD/ ED APP/ or Specialty MD on call and as needed.  Assessment & Plan  Patient presenting with watery diarrhea abdominal discomfort and positive stool test for C. difficile toxin with a history of prior CDI treated with vancomycin  p.o. >> Nausea vomiting diarrhea abdominal pain: Secondary to C. difficile infection recurrence. Patient is currently not on any antibiotics.  And we will initiate treatment with vancomycin  125 mg p.o. 4 times a day x 10 days or fidaxomicin 200 mg 2 times a day for 10 days. Aggressive contact precautions.  IV fluids. Monitoring electrolytes and replacement therapy. ID consulted.  >> Anxiety: Will continue patient on her Zoloft .   DVT prophylaxis:  Heparin Consults:  ID Advance Care Planning:    Code Status: Not on file   Family Communication:  None Disposition Plan:  Home Severity of Illness: The appropriate patient status for this patient is INPATIENT. Inpatient status is judged to be reasonable and necessary in order to provide the required intensity of service to ensure the patient's safety. The patient's  presenting symptoms, physical exam findings, and initial radiographic and laboratory data in the context of their chronic comorbidities is felt to place them at high risk for further clinical deterioration. Furthermore, it is not anticipated that the patient will be medically stable for discharge from the hospital within 2 midnights of admission.   * I certify that at the point of admission it is my clinical judgment that the patient will require inpatient hospital care spanning beyond 2 midnights from the point of admission due to high intensity of service, high risk for further deterioration and high frequency of surveillance required.*  Unresulted Labs (From admission, onward)     Start     Ordered   04/04/24 0500  Occult blood card to lab, stool RN will collect  Daily,   R     Question:  Specimen to be collected by:  Answer:  RN will collect   04/03/24 1514   04/03/24 1551  Magnesium  Add-on,   AD        04/03/24 1551   04/03/24 1515  Gastrointestinal Panel by PCR , Stool  (Gastrointestinal Panel by PCR, Stool                                                                                                                                                     **  Does Not include CLOSTRIDIUM DIFFICILE testing. **If CDIFF testing is needed, place order from the C Difficile Testing order set.**)  Once,   URGENT        04/03/24 1514   04/03/24 1515  Lactoferrin, Fecal, Qualitative  Once,   URGENT        04/03/24 1514   04/03/24 1513  Rapid urine drug screen (hospital performed)  Add-on,   AD        04/03/24 1514   04/03/24 1512  Lactic acid, plasma  (Lactic Acid)  STAT Now then every 3 hours,   R      04/03/24 1514   04/03/24 1511  Ethanol  Add-on,   AD        04/03/24 1514            Meds ordered this encounter  Medications   sodium chloride  0.9 % bolus 2,000 mL   ondansetron (ZOFRAN) injection 4 mg   ketorolac (TORADOL) 30 MG/ML injection 30 mg   iohexol (OMNIPAQUE) 350 MG/ML  injection 75 mL   vancomycin  (VANCOCIN ) capsule 125 mg   lactated ringers infusion   DISCONTD: pantoprazole (PROTONIX) injection 40 mg   famotidine  (PEPCID ) IVPB 20 mg premix     Orders Placed This Encounter  Procedures   C Difficile Quick Screen w PCR reflex   Gastrointestinal Panel by PCR , Stool   CT ABDOMEN PELVIS W CONTRAST   Lipase, blood   Comprehensive metabolic panel   CBC   Urinalysis, Routine w reflex microscopic -Urine, Clean Catch   hCG, serum, qualitative   Ethanol   Lactic acid, plasma   Occult blood card to lab, stool RN will collect   Rapid urine drug screen (hospital performed)   Lactoferrin, Fecal, Qualitative   Magnesium   Diet NPO time specified   ED Cardiac monitoring   Consult to infectious diseases   Consult to hospitalist   Enteric precautions (UV disinfection)   Saline lock IV    Author: Lavanda Porter, MD 12 pm- 8 pm. [facility]. 04/03/2024 3:59 PM Please note for any communication after hours contact [facility] Assigned provider on call on Amion.

## 2024-04-03 NOTE — ED Triage Notes (Signed)
 Pt states she was being treated for c. Diff, had antibiotics that were finished a week ago. Pt started having diarrhea, body aching, chills and sweats that started 4 days ago. Vomiting started this am. Pt was told by PCP to come to ED for dehydration.

## 2024-04-04 ENCOUNTER — Ambulatory Visit: Payer: Self-pay | Admitting: Family Medicine

## 2024-04-04 ENCOUNTER — Telehealth (HOSPITAL_COMMUNITY): Payer: Self-pay | Admitting: Pharmacy Technician

## 2024-04-04 ENCOUNTER — Other Ambulatory Visit (HOSPITAL_COMMUNITY): Payer: Self-pay

## 2024-04-04 DIAGNOSIS — R197 Diarrhea, unspecified: Secondary | ICD-10-CM

## 2024-04-04 DIAGNOSIS — R112 Nausea with vomiting, unspecified: Secondary | ICD-10-CM

## 2024-04-04 LAB — GASTROINTESTINAL PANEL BY PCR, STOOL (REPLACES STOOL CULTURE)

## 2024-04-04 LAB — COMPREHENSIVE METABOLIC PANEL WITH GFR
ALT: 12 U/L (ref 0–44)
AST: 17 U/L (ref 15–41)
Albumin: 2.4 g/dL — ABNORMAL LOW (ref 3.5–5.0)
Alkaline Phosphatase: 48 U/L (ref 38–126)
Anion gap: 8 (ref 5–15)
BUN: 5 mg/dL — ABNORMAL LOW (ref 6–20)
CO2: 21 mmol/L — ABNORMAL LOW (ref 22–32)
Calcium: 7.5 mg/dL — ABNORMAL LOW (ref 8.9–10.3)
Chloride: 101 mmol/L (ref 98–111)
Creatinine, Ser: 0.96 mg/dL (ref 0.44–1.00)
GFR, Estimated: >60 mL/min (ref >60)
Glucose, Bld: 118 mg/dL — ABNORMAL HIGH (ref 70–99)
Potassium: 3.4 mmol/L — ABNORMAL LOW (ref 3.5–5.1)
Sodium: 130 mmol/L — ABNORMAL LOW (ref 135–145)
Total Bilirubin: 1 mg/dL (ref 0.0–1.2)
Total Protein: 5.2 g/dL — ABNORMAL LOW (ref 6.5–8.1)

## 2024-04-04 LAB — CBC
HCT: 29 % — ABNORMAL LOW (ref 36.0–46.0)
Hemoglobin: 9.6 g/dL — ABNORMAL LOW (ref 12.0–15.0)
MCH: 28.7 pg (ref 26.0–34.0)
MCHC: 33.1 g/dL (ref 30.0–36.0)
MCV: 86.6 fL (ref 80.0–100.0)
Platelets: 172 10*3/uL (ref 150–400)
RBC: 3.35 MIL/uL — ABNORMAL LOW (ref 3.87–5.11)
RDW: 13.7 % (ref 11.5–15.5)
WBC: 4.9 10*3/uL (ref 4.0–10.5)
nRBC: 0 % (ref 0.0–0.2)

## 2024-04-04 LAB — MAGNESIUM: Magnesium: 1.2 mg/dL — ABNORMAL LOW (ref 1.7–2.4)

## 2024-04-04 LAB — GLUCOSE, CAPILLARY: Glucose-Capillary: 92 mg/dL (ref 70–99)

## 2024-04-04 MED ORDER — SERTRALINE HCL 100 MG PO TABS
100.0000 mg | ORAL_TABLET | Freq: Every day | ORAL | Status: DC
  Administered 2024-04-04 – 2024-04-07 (×4): 100 mg via ORAL
  Filled 2024-04-04 (×4): qty 1

## 2024-04-04 MED ORDER — FIDAXOMICIN 200 MG PO TABS
200.0000 mg | ORAL_TABLET | Freq: Two times a day (BID) | ORAL | Status: DC
Start: 2024-04-04 — End: 2024-04-07
  Administered 2024-04-04 – 2024-04-07 (×7): 200 mg via ORAL
  Filled 2024-04-04 (×8): qty 1

## 2024-04-04 MED ORDER — MAGNESIUM SULFATE 4 GM/100ML IV SOLN
4.0000 g | Freq: Once | INTRAVENOUS | Status: AC
  Administered 2024-04-04: 4 g via INTRAVENOUS
  Filled 2024-04-04: qty 100

## 2024-04-04 MED ORDER — MORPHINE SULFATE (PF) 2 MG/ML IV SOLN
2.0000 mg | INTRAVENOUS | Status: DC | PRN
  Administered 2024-04-04 – 2024-04-07 (×6): 2 mg via INTRAVENOUS
  Filled 2024-04-04 (×6): qty 1

## 2024-04-04 MED ORDER — POTASSIUM CHLORIDE CRYS ER 20 MEQ PO TBCR
30.0000 meq | EXTENDED_RELEASE_TABLET | ORAL | Status: AC
  Administered 2024-04-04 (×2): 30 meq via ORAL
  Filled 2024-04-04 (×2): qty 1

## 2024-04-04 MED ORDER — FIDAXOMICIN 200 MG PO TABS: 200.0000 mg | ORAL_TABLET | Freq: Two times a day (BID) | ORAL | Status: DC

## 2024-04-04 MED ORDER — MAGNESIUM SULFATE 2 GM/50ML IV SOLN
2.0000 g | Freq: Once | INTRAVENOUS | Status: AC
  Administered 2024-04-04: 2 g via INTRAVENOUS
  Filled 2024-04-04: qty 50

## 2024-04-04 NOTE — Progress Notes (Addendum)
   04/04/24 1000  Spiritual Encounters  Type of Visit Initial  Care provided to: Pt and family  Reason for visit Advance directives  OnCall Visit No  Interventions  Spiritual Care Interventions Made Established relationship of care and support;Compassionate presence;Decision-making support/facilitation  Intervention Outcomes  Outcomes Connection to values and goals of care  Advance Directives (For Healthcare)  Does Patient Have a Medical Advance Directive? No  Would patient like information on creating a medical advance directive? Yes (Inpatient - patient defers creating a medical advance directive at this time - Information given)

## 2024-04-04 NOTE — Plan of Care (Signed)

## 2024-04-04 NOTE — Telephone Encounter (Signed)
 Patient Product/process development scientist completed.    The patient is insured through [facility]. Patient has [employer], may use a copay card, and/or apply for patient assistance if available.    Ran test claim for Dificid 200 mg and the current 10 day co-pay is $4,828.93 due to a $7500 deductible.   This test claim was processed through [facility]- copay amounts may vary at other pharmacies due to pharmacy/plan contracts, or as the patient moves through the different stages of their insurance plan.     Morgan Arab, CPHT Pharmacy Technician III Certified Patient Advocate [facility] Patient Advocate Team Direct Number: [phone]  Fax: [phone]

## 2024-04-04 NOTE — Progress Notes (Signed)
 PROGRESS NOTE  Kristen Lane  ZOX:[id number] DOB: 03-19-1986 DOA: 04/03/2024 PCP: Abram Abraham, NP-C  Consultants  Brief Narrative: 38 y.o. female with a PMH significant for 38 y.o. female with past medical history  of  Allergy, history of anemia, anxiety, asthma, C. difficile recently treated with p.o. vancomycin  course comes with vomiting that started today.  Patient was not being recurrence of diarrhea body aches generalized weakness chills night sweats that has been going on for the past 4 days.  EDMD discussed case with ID provider who recommended admission for C. difficile recurrence.    Assessment & Plan: Nausea vomiting diarrhea abdominal pain: Secondary to C. difficile infection recurrence, most likely diagnosis.  No hematochezia or melena and this is patient's only time with this type of diarrhea so not likely to be IBD.  CT scan reported concern for possible IBD. No URI/lung symptoms.   Discussed with ID on-call who recommended the fidaxomicin since patient had already completed 8 course of vancomycin .   With abdominal pain, profuse diarrhea since admission.  No real improvement since admission. I have increased her pain control to morphine  Continue IV fluids and monitor electrolytes.    Anxiety: Will continue patient on her Zoloft .  Hyponatremia: Secondary to fluid losses/hypotonic hyponatremia. - Continue IV fluids and continue oral rehydration as she can tolerate  Hypokalemia: - Secondary to #1 above as well.  Replace potassium.  Non-anion gap metabolic acidosis: - Secondary to ongoing diarrhea.  Treating with oral antibiotics as above  Hypomagnesemia: - Will follow electrolytes and replacing magnesium  Pseudo hypocalcemia: - Will continue to trend but this is secondary to decreased albumin  Normocytic anemia: - Denies any current bleeding. - Notable drop from May of this year.   - Will follow/transfuse for Hgb < 7    DVT prophylaxis:  heparin  injection 5,000 Units Start: 04/03/24 2200  Code Status:   Code Status: Full Code Level of care: Telemetry Medical Status is: Inpatient Dispo: Inpatient for likely at least 1 more day due to her symptoms   Consults called: None  Subjective: Patient still with diarrhea and abdominal pain.  No further vomiting.  Admission to the floor.  Still with some ongoing nausea.  Also with fevers, chills, aching.  Objective: Vitals:   04/03/24 2343 04/04/24 0440 04/04/24 0500 04/04/24 0743  BP: (!) 98/50 (!) 95/48  (!) 92/49  Pulse: (!) 105 (!) 113    Resp: 18 18  18   Temp: 98.3 F (36.8 C) (!) 102.2 F (39 C)  98.4 F (36.9 C)  TempSrc: Oral     SpO2: 100% 97%  98%  Weight:   55.3 kg   Height:        Intake/Output Summary (Last 24 hours) at 04/04/2024 1545 Last data filed at 04/03/2024 1718 Gross per 24 hour  Intake 654.25 ml  Output --  Net 654.25 ml   Filed Weights   04/03/24 1111 04/04/24 0500  Weight: 55.3 kg 55.3 kg   Body mass index is 18.02 kg/m.  Gen: 38 y.o. female in no apparent distress.  Nontoxic Pulm: Non-labored breathing.  Clear to auscultation bilaterally.  CV: Minimally tachycardic with regular rhythm GI: Abdomen soft, nondistended and moderately tender throughout without any guarding or rebound.  Hyperactive bowel sounds Ext: Warm, no deformities, no pedal edema Skin: No rashes, lesions no ulcers Neuro: Alert and oriented. No focal neurological deficits. Psych: Calm  Judgement and insight appear normal. Mood & affect appropriate.     I  have personally reviewed the following labs and images: CBC: Recent Labs  Lab 04/03/24 1119 04/03/24 1751 04/04/24 0421  WBC 4.7 3.1* 4.9  HGB 12.0 9.6* 9.6*  HCT 36.6 28.8* 29.0*  MCV 85.9 85.2 86.6  PLT 250 174 172   BMP &GFR Recent Labs  Lab 04/03/24 1119 04/03/24 1551 04/04/24 0421  NA 136  --  130*  K 3.7  --  3.4*  CL 104  --  101  CO2 20*  --  21*  GLUCOSE 109*  --  118*  BUN 6  --  5*  CREATININE  0.75  --  0.96  CALCIUM 9.1  --  7.5*  MG  --  2.0 1.2*   Estimated Creatinine Clearance: 70 mL/min (by C-G formula based on SCr of 0.96 mg/dL). Liver & Pancreas: Recent Labs  Lab 04/03/24 1119 04/04/24 0421  AST 16 17  ALT 13 12  ALKPHOS 47 48  BILITOT 0.6 1.0  PROT 7.0 5.2*  ALBUMIN 3.3* 2.4*   Recent Labs  Lab 04/03/24 1119  LIPASE 21   No results for input(s): AMMONIA in the last 168 hours. Diabetic: No results for input(s): HGBA1C in the last 72 hours. No results for input(s): GLUCAP in the last 168 hours. Cardiac Enzymes: No results for input(s): CKTOTAL, CKMB, CKMBINDEX, TROPONINI in the last 168 hours. No results for input(s): PROBNP in the last 8760 hours. Coagulation Profile: No results for input(s): INR, PROTIME in the last 168 hours. Thyroid  Function Tests: No results for input(s): TSH, T4TOTAL, FREET4, T3FREE, THYROIDAB in the last 72 hours. Lipid Profile: No results for input(s): CHOL, HDL, LDLCALC, TRIG, CHOLHDL, LDLDIRECT in the last 72 hours. Anemia Panel: No results for input(s): VITAMINB12, FOLATE, FERRITIN, TIBC, IRON, RETICCTPCT in the last 72 hours. Urine analysis:    Component Value Date/Time   COLORURINE YELLOW 04/03/2024 1113   APPEARANCEUR HAZY (A) 04/03/2024 1113   LABSPEC 1.016 04/03/2024 1113   PHURINE 5.0 04/03/2024 1113   GLUCOSEU NEGATIVE 04/03/2024 1113   HGBUR SMALL (A) 04/03/2024 1113   BILIRUBINUR NEGATIVE 04/03/2024 1113   BILIRUBINUR negative 01/28/2018 1504   KETONESUR NEGATIVE 04/03/2024 1113   PROTEINUR NEGATIVE 04/03/2024 1113   UROBILINOGEN 0.2 01/28/2018 1504   NITRITE NEGATIVE 04/03/2024 1113   LEUKOCYTESUR NEGATIVE 04/03/2024 1113   Sepsis Labs: Invalid input(s): PROCALCITONIN, LACTICIDVEN  Microbiology: Recent Results (from the past 240 hours)  C Difficile Quick Screen w PCR reflex     Status: Abnormal   Collection Time: 04/03/24 11:45 AM   Specimen:  STOOL  Result Value Ref Range Status   C Diff antigen POSITIVE (A) NEGATIVE Final   C Diff toxin POSITIVE (A) NEGATIVE Final   C Diff interpretation Toxin producing C. difficile detected.  Final    Comment: Performed at [facility], [address] [place], [place] [address]  Gastrointestinal Panel by PCR , Stool     Status: None   Collection Time: 04/03/24  3:41 PM   Specimen: Stool  Result Value Ref Range Status   Campylobacter species NOT DETECTED NOT DETECTED Final   Plesimonas shigelloides NOT DETECTED NOT DETECTED Final   Salmonella species NOT DETECTED NOT DETECTED Final   Yersinia enterocolitica NOT DETECTED NOT DETECTED Final   Vibrio species NOT DETECTED NOT DETECTED Final   Vibrio cholerae NOT DETECTED NOT DETECTED Final   Enteroaggregative E coli (EAEC) NOT DETECTED NOT DETECTED Final   Enteropathogenic E coli (EPEC) NOT DETECTED NOT DETECTED Final   Enterotoxigenic  E coli (ETEC) NOT DETECTED NOT DETECTED Final   Shiga like toxin producing E coli (STEC) NOT DETECTED NOT DETECTED Final   Shigella/Enteroinvasive E coli (EIEC) NOT DETECTED NOT DETECTED Final   Cryptosporidium NOT DETECTED NOT DETECTED Final   Cyclospora cayetanensis NOT DETECTED NOT DETECTED Final   Entamoeba histolytica NOT DETECTED NOT DETECTED Final   Giardia lamblia NOT DETECTED NOT DETECTED Final   Adenovirus F40/41 NOT DETECTED NOT DETECTED Final   Astrovirus NOT DETECTED NOT DETECTED Final   Norovirus GI/GII NOT DETECTED NOT DETECTED Final   Rotavirus A NOT DETECTED NOT DETECTED Final   Sapovirus (I, II, IV, and V) NOT DETECTED NOT DETECTED Final    Comment: Performed at [facility], [address], [place], [place] [address]    Radiology Studies: No results found.  Scheduled Meds:  fidaxomicin  200 mg Oral BID   heparin  5,000 Units Subcutaneous Q12H   potassium chloride  30 mEq Oral Q4H   sertraline   100 mg Oral Daily   sodium chloride  flush  3 mL Intravenous Q12H    Continuous Infusions:  famotidine  (PEPCID ) IV 20 mg (04/04/24 1051)   magnesium sulfate bolus IVPB 4 g (04/04/24 1528)   Followed by   magnesium sulfate bolus IVPB       LOS: 1 day   35 minutes with more than 50% spent in reviewing records, counseling patient/family and coordinating care.  Trenton Frock, MD [facility] [email or web address] 04/04/2024, 3:45 PM

## 2024-04-05 DIAGNOSIS — R197 Diarrhea, unspecified: Secondary | ICD-10-CM | POA: Diagnosis not present

## 2024-04-05 DIAGNOSIS — R112 Nausea with vomiting, unspecified: Secondary | ICD-10-CM | POA: Diagnosis not present

## 2024-04-05 LAB — CBC
HCT: 30.2 % — ABNORMAL LOW (ref 36.0–46.0)
Hemoglobin: 10.2 g/dL — ABNORMAL LOW (ref 12.0–15.0)
MCH: 29 pg (ref 26.0–34.0)
MCHC: 33.8 g/dL (ref 30.0–36.0)
MCV: 85.8 fL (ref 80.0–100.0)
Platelets: 209 10*3/uL (ref 150–400)
RBC: 3.52 MIL/uL — ABNORMAL LOW (ref 3.87–5.11)
RDW: 13.8 % (ref 11.5–15.5)
WBC: 8.6 10*3/uL (ref 4.0–10.5)
nRBC: 0 % (ref 0.0–0.2)

## 2024-04-05 LAB — BASIC METABOLIC PANEL WITH GFR
Anion gap: 5 (ref 5–15)
BUN: <5 mg/dL — ABNORMAL LOW (ref 6–20)
CO2: 27 mmol/L (ref 22–32)
Calcium: 7.4 mg/dL — ABNORMAL LOW (ref 8.9–10.3)
Chloride: 100 mmol/L (ref 98–111)
Creatinine, Ser: 0.87 mg/dL (ref 0.44–1.00)
GFR, Estimated: >60 mL/min (ref >60)
Glucose, Bld: 102 mg/dL — ABNORMAL HIGH (ref 70–99)
Potassium: 3.6 mmol/L (ref 3.5–5.1)
Sodium: 132 mmol/L — ABNORMAL LOW (ref 135–145)

## 2024-04-05 LAB — MAGNESIUM: Magnesium: 2.4 mg/dL (ref 1.7–2.4)

## 2024-04-05 MED ORDER — SODIUM CHLORIDE 0.9 % IV BOLUS
500.0000 mL | Freq: Once | INTRAVENOUS | Status: AC
  Administered 2024-04-05: 500 mL via INTRAVENOUS

## 2024-04-05 NOTE — Progress Notes (Signed)
 PROGRESS NOTE  Kristen Lane  ZOX:[id number] DOB: 04-11-1986 DOA: 04/03/2024 PCP: Abram Abraham, NP-C  Consultants  Brief Narrative: 38 y.o. female with a PMH significant for 38 y.o. female with past medical history  of  Allergy, history of anemia, anxiety, asthma, C. difficile recently treated with p.o. vancomycin  course comes with vomiting that started today.  Patient was not being recurrence of diarrhea body aches generalized weakness chills night sweats that has been going on for the past 4 days.  ED MD discussed case with ID provider who recommended admission for C. difficile recurrence.   Assessment & Plan: C dif recurrence: Much improved today compared to yesterday.  Her fever curve has been downtrending. - Still with some abdominal pain especially when moving from sitting to standing or walking around room but overall even this is better than yesterday. - No nausea or vomiting. - She continues to have diarrhea that is watery. -She is hungry and would like to try some full liquids - Plan will be to continue current therapy with fidaxomicin as this seems to working well   Anxiety: Will continue patient on her Zoloft .  Hyponatremia: - Secondary to fluid losses/hypotonic hyponatremia. - improved with improving fluid intake  Hypokalemia: - Secondary to #1 above as well.  Replace potassium.  Non-anion gap metabolic acidosis: - Secondary to ongoing diarrhea, now resolved.   Hypomagnesemia: - Will follow electrolytes and replacing magnesium, recheck in AM  Pseudo hypocalcemia: - Will continue to trend but this is secondary to decreased albumin - will recheck AM ionized calcium  Normocytic anemia: - Denies any current bleeding. - Notable drop from May of this year.   - Will follow/transfuse for Hgb < 7    DVT prophylaxis:  heparin injection 5,000 Units Start: 04/03/24 2200  Code Status:   Code Status: Full Code Level of care: Telemetry Medical Status is:  Inpatient Dispo: Inpatient for likely at least 1 more day due to her symptoms   Consults called: None  Subjective: Patient still with diarrhea and abdominal pain but overall feels much better than she has.  She is now hungry.  Sweating stopped last night  Objective: Vitals:   04/05/24 0405 04/05/24 0500 04/05/24 0810 04/05/24 1236  BP: 97/61  (!) 100/55 112/62  Pulse: 98  88 81  Resp: 19  18 17   Temp: 99.3 F (37.4 C)  98.9 F (37.2 C) 98.4 F (36.9 C)  TempSrc:      SpO2: 97%  97% 98%  Weight:  59 kg    Height:        Intake/Output Summary (Last 24 hours) at 04/05/2024 1505 Last data filed at 04/05/2024 0054 Gross per 24 hour  Intake 780 ml  Output --  Net 780 ml   Filed Weights   04/03/24 1111 04/04/24 0500 04/05/24 0500  Weight: 55.3 kg 55.3 kg 59 kg   Body mass index is 19.21 kg/m.  Gen: 38 y.o. female in no apparent distress.  Nontoxic Pulm: Non-labored breathing.  Clear to auscultation bilaterally.  CV: RRR  GI: Abdomen soft, nondistended but still moderately tender throughout without any guarding or rebound.  Normal bowel sounds Ext: Warm, no deformities, no pedal edema Skin: No rashes, lesions no ulcers Neuro: Alert and oriented. No focal neurological deficits. Psych: Calm  Judgement and insight appear normal. Mood & affect appropriate.     I have personally reviewed the following labs and images: CBC: Recent Labs  Lab 04/03/24 1119 04/03/24 1751 04/04/24 0421 04/05/24 0448  WBC 4.7 3.1* 4.9 8.6  HGB 12.0 9.6* 9.6* 10.2*  HCT 36.6 28.8* 29.0* 30.2*  MCV 85.9 85.2 86.6 85.8  PLT 250 174 172 209   BMP &GFR Recent Labs  Lab 04/03/24 1119 04/03/24 1551 04/04/24 0421 04/05/24 0448  NA 136  --  130* 132*  K 3.7  --  3.4* 3.6  CL 104  --  101 100  CO2 20*  --  21* 27  GLUCOSE 109*  --  118* 102*  BUN 6  --  5* <5*  CREATININE 0.75  --  0.96 0.87  CALCIUM 9.1  --  7.5* 7.4*  MG  --  2.0 1.2* 2.4   Estimated Creatinine Clearance: 82.5  mL/min (by C-G formula based on SCr of 0.87 mg/dL). Liver & Pancreas: Recent Labs  Lab 04/03/24 1119 04/04/24 0421  AST 16 17  ALT 13 12  ALKPHOS 47 48  BILITOT 0.6 1.0  PROT 7.0 5.2*  ALBUMIN 3.3* 2.4*   Recent Labs  Lab 04/03/24 1119  LIPASE 21   No results for input(s): AMMONIA in the last 168 hours. Diabetic: No results for input(s): HGBA1C in the last 72 hours. Recent Labs  Lab 04/04/24 1721  GLUCAP 92   Cardiac Enzymes: No results for input(s): CKTOTAL, CKMB, CKMBINDEX, TROPONINI in the last 168 hours. No results for input(s): PROBNP in the last 8760 hours. Coagulation Profile: No results for input(s): INR, PROTIME in the last 168 hours. Thyroid  Function Tests: No results for input(s): TSH, T4TOTAL, FREET4, T3FREE, THYROIDAB in the last 72 hours. Lipid Profile: No results for input(s): CHOL, HDL, LDLCALC, TRIG, CHOLHDL, LDLDIRECT in the last 72 hours. Anemia Panel: No results for input(s): VITAMINB12, FOLATE, FERRITIN, TIBC, IRON, RETICCTPCT in the last 72 hours. Urine analysis:    Component Value Date/Time   COLORURINE YELLOW 04/03/2024 1113   APPEARANCEUR HAZY (A) 04/03/2024 1113   LABSPEC 1.016 04/03/2024 1113   PHURINE 5.0 04/03/2024 1113   GLUCOSEU NEGATIVE 04/03/2024 1113   HGBUR SMALL (A) 04/03/2024 1113   BILIRUBINUR NEGATIVE 04/03/2024 1113   BILIRUBINUR negative 01/28/2018 1504   KETONESUR NEGATIVE 04/03/2024 1113   PROTEINUR NEGATIVE 04/03/2024 1113   UROBILINOGEN 0.2 01/28/2018 1504   NITRITE NEGATIVE 04/03/2024 1113   LEUKOCYTESUR NEGATIVE 04/03/2024 1113   Sepsis Labs: Invalid input(s): PROCALCITONIN, LACTICIDVEN  Microbiology: Recent Results (from the past 240 hours)  C Difficile Quick Screen w PCR reflex     Status: Abnormal   Collection Time: 04/03/24 11:45 AM   Specimen: STOOL  Result Value Ref Range Status   C Diff antigen POSITIVE (A) NEGATIVE Final   C Diff toxin POSITIVE  (A) NEGATIVE Final   C Diff interpretation Toxin producing C. difficile detected.  Final    Comment: Performed at [facility], [address] [place], [place] [address]  Gastrointestinal Panel by PCR , Stool     Status: None   Collection Time: 04/03/24  3:41 PM   Specimen: Stool  Result Value Ref Range Status   Campylobacter species NOT DETECTED NOT DETECTED Final   Plesimonas shigelloides NOT DETECTED NOT DETECTED Final   Salmonella species NOT DETECTED NOT DETECTED Final   Yersinia enterocolitica NOT DETECTED NOT DETECTED Final   Vibrio species NOT DETECTED NOT DETECTED Final   Vibrio cholerae NOT DETECTED NOT DETECTED Final   Enteroaggregative E coli (EAEC) NOT DETECTED NOT DETECTED Final   Enteropathogenic E coli (EPEC) NOT DETECTED NOT DETECTED Final   Enterotoxigenic E coli (ETEC)  NOT DETECTED NOT DETECTED Final   Shiga like toxin producing E coli (STEC) NOT DETECTED NOT DETECTED Final   Shigella/Enteroinvasive E coli (EIEC) NOT DETECTED NOT DETECTED Final   Cryptosporidium NOT DETECTED NOT DETECTED Final   Cyclospora cayetanensis NOT DETECTED NOT DETECTED Final   Entamoeba histolytica NOT DETECTED NOT DETECTED Final   Giardia lamblia NOT DETECTED NOT DETECTED Final   Adenovirus F40/41 NOT DETECTED NOT DETECTED Final   Astrovirus NOT DETECTED NOT DETECTED Final   Norovirus GI/GII NOT DETECTED NOT DETECTED Final   Rotavirus A NOT DETECTED NOT DETECTED Final   Sapovirus (I, II, IV, and V) NOT DETECTED NOT DETECTED Final    Comment: Performed at [facility], [address] [place], [place] [address]    Radiology Studies: No results found.  Scheduled Meds:  fidaxomicin  200 mg Oral BID   heparin  5,000 Units Subcutaneous Q12H   sertraline   100 mg Oral Daily   sodium chloride  flush  3 mL Intravenous Q12H   Continuous Infusions:  famotidine  (PEPCID ) IV 20 mg (04/05/24 1003)     LOS: 2 days   35 minutes with more than 50% spent in reviewing records,  counseling patient/family and coordinating care.  Trenton Frock, MD [facility] [email or web address] 04/05/2024, 3:05 PM

## 2024-04-05 NOTE — Plan of Care (Signed)

## 2024-04-06 DIAGNOSIS — R112 Nausea with vomiting, unspecified: Secondary | ICD-10-CM | POA: Diagnosis not present

## 2024-04-06 DIAGNOSIS — R197 Diarrhea, unspecified: Secondary | ICD-10-CM | POA: Diagnosis not present

## 2024-04-06 LAB — CBC
HCT: 29.7 % — ABNORMAL LOW (ref 36.0–46.0)
Hemoglobin: 9.7 g/dL — ABNORMAL LOW (ref 12.0–15.0)
MCH: 28.6 pg (ref 26.0–34.0)
MCHC: 32.7 g/dL (ref 30.0–36.0)
MCV: 87.6 fL (ref 80.0–100.0)
Platelets: 214 10*3/uL (ref 150–400)
RBC: 3.39 MIL/uL — ABNORMAL LOW (ref 3.87–5.11)
RDW: 13.7 % (ref 11.5–15.5)
WBC: 6.1 10*3/uL (ref 4.0–10.5)
nRBC: 0 % (ref 0.0–0.2)

## 2024-04-06 LAB — COMPREHENSIVE METABOLIC PANEL WITH GFR
ALT: 13 U/L (ref 0–44)
AST: 14 U/L — ABNORMAL LOW (ref 15–41)
Albumin: 2.2 g/dL — ABNORMAL LOW (ref 3.5–5.0)
Alkaline Phosphatase: 49 U/L (ref 38–126)
Anion gap: 6 (ref 5–15)
BUN: <5 mg/dL — ABNORMAL LOW (ref 6–20)
CO2: 25 mmol/L (ref 22–32)
Calcium: 7.4 mg/dL — ABNORMAL LOW (ref 8.9–10.3)
Chloride: 103 mmol/L (ref 98–111)
Creatinine, Ser: 0.79 mg/dL (ref 0.44–1.00)
GFR, Estimated: >60 mL/min (ref >60)
Glucose, Bld: 84 mg/dL (ref 70–99)
Potassium: 3.2 mmol/L — ABNORMAL LOW (ref 3.5–5.1)
Sodium: 134 mmol/L — ABNORMAL LOW (ref 135–145)
Total Bilirubin: 0.8 mg/dL (ref 0.0–1.2)
Total Protein: 4.9 g/dL — ABNORMAL LOW (ref 6.5–8.1)

## 2024-04-06 LAB — MAGNESIUM: Magnesium: 2 mg/dL (ref 1.7–2.4)

## 2024-04-06 MED ORDER — POTASSIUM CHLORIDE CRYS ER 20 MEQ PO TBCR
40.0000 meq | EXTENDED_RELEASE_TABLET | Freq: Once | ORAL | Status: AC
  Administered 2024-04-06: 40 meq via ORAL
  Filled 2024-04-06: qty 2

## 2024-04-06 NOTE — Progress Notes (Signed)
 PROGRESS NOTE  Kristen Lane  ZOX:[id number] DOB: 1986/04/10 DOA: 04/03/2024 PCP: Abram Abraham, NP-C  [facility]ult[facility]  Brief Narrative: 38 y.o. female with a PMH significant for 38 y.o. female with past medical history  of  Allergy, history of anemia, anxiety, asthma, C. difficile recently treated with p.o. vancomycin  course comes with vomiting that started today.  Patient was not being recurrence of diarrhea body aches generalized weakness chills night sweats that has been going on for the past 4 days.  ED MD discussed case with ID provider who recommended admission for C. difficile recurrence.   Assessment & Plan: C dif recurrence: - Continues to improve.  No further fevers. -That said, she has been hesitant to try anything beyond full liquid diet.  Will attempt soft diet tonight. - Still with some abdominal pain especially when moving from sitting to standing or walking around room but overall better - No nausea or vomiting. - She continues to have diarrhea that is watery.  Had 4 episodes throughout the day today. -She is hungry and would like to try some full liquids - Plan will be to continue current therapy with fidaxomicin as this seems to working well   Anxiety: Will continue patient on her Zoloft .  Hyponatremia: - Secondary to fluid losses/hypotonic hyponatremia. - improved with improving fluid intake  Hypokalemia: - Secondary to #1 above as well.  Replace potassium x 2 today  Non-anion gap metabolic acidosis: - Secondary to ongoing diarrhea, now resolved.   Hypomagnesemia: - Has been replaced and normalized today.  Pseudo hypocalcemia: - Will continue to trend but this is secondary to decreased albumin.  Normal when corrected for low albumin  Normocytic anemia: - Denies any current bleeding. - Notable drop from May of this year.   - Will follow/transfuse for Hgb < 7    DVT prophylaxis:  heparin injection 5,000 Units Start: 04/03/24 2200  Code Status:   Code  Status: Full Code Level of care: Telemetry Medical Status is: Inpatient Dispo: Inpatient for likely at least 1 more day due to her symptoms   Consults called: None  Subjective: Patient still with diarrhea and abdominal pain but overall feels much better than she has.  No further fevers.  She would like to try something a little bit more solid than full liquid diet.  No lightheadedness  Objective: Vitals:   04/06/24 0028 04/06/24 0422 04/06/24 0500 04/06/24 0937  BP: (!) 102/56 (!) 98/58  (!) 102/57  Pulse: 77 71  68  Resp: 18 18  17   Temp: 98.3 F (36.8 C) 98.4 F (36.9 C)  98 F (36.7 C)  TempSrc:      SpO2: 97% 97%  97%  Weight:   59.2 kg   Height:        Intake/Output Summary (Last 24 hours) at 04/06/2024 1508 Last data filed at 04/06/2024 0054 Gross per 24 hour  Intake 200 ml  Output --  Net 200 ml   Filed Weights   04/04/24 0500 04/05/24 0500 04/06/24 0500  Weight: 55.3 kg 59 kg 59.2 kg   Body mass index is 19.27 kg/m.  Gen: 38 y.o. female in no apparent distress.  Nontoxic Pulm: Non-labored breathing.  Clear to auscultation bilaterally.  CV: RRR  GI: Abdomen soft, nondistended but still moderately tender throughout without any guarding or rebound.  That said, less tender than she has been.  Normal bowel sounds Ext: Warm, no deformities, no pedal edema Skin: No rashes, lesions no ulcers Neuro: Alert and oriented.  No focal neurological deficits. Psych: Calm  Judgement and insight appear normal. Mood & affect appropriate.     I have personally reviewed the following labs and images: CBC: Recent Labs  Lab 04/03/24 1119 04/03/24 1751 04/04/24 0421 04/05/24 0448 04/06/24 0428  WBC 4.7 3.1* 4.9 8.6 6.1  HGB 12.0 9.6* 9.6* 10.2* 9.7*  HCT 36.6 28.8* 29.0* 30.2* 29.7*  MCV 85.9 85.2 86.6 85.8 87.6  PLT 250 174 172 209 214   BMP &GFR Recent Labs  Lab 04/03/24 1119 04/03/24 1551 04/04/24 0421 04/05/24 0448 04/06/24 0428  NA 136  --  130* 132* 134*   K 3.7  --  3.4* 3.6 3.2*  CL 104  --  101 100 103  CO2 20*  --  21* 27 25  GLUCOSE 109*  --  118* 102* 84  BUN 6  --  5* <5* <5*  CREATININE 0.75  --  0.96 0.87 0.79  CALCIUM 9.1  --  7.5* 7.4* 7.4*  MG  --  2.0 1.2* 2.4 2.0   Estimated Creatinine Clearance: 90 mL/min (by C-G formula based on SCr of 0.79 mg/dL). Liver & Pancreas: Recent Labs  Lab 04/03/24 1119 04/04/24 0421 04/06/24 0428  AST 16 17 14*  ALT 13 12 13   ALKPHOS 47 48 49  BILITOT 0.6 1.0 0.8  PROT 7.0 5.2* 4.9*  ALBUMIN 3.3* 2.4* 2.2*   Recent Labs  Lab 04/03/24 1119  LIPASE 21   No results for input(s): AMMONIA in the last 168 hours. Diabetic: No results for input(s): HGBA1C in the last 72 hours. Recent Labs  Lab 04/04/24 1721  GLUCAP 92   Cardiac Enzymes: No results for input(s): CKTOTAL, CKMB, CKMBINDEX, TROPONINI in the last 168 hours. No results for input(s): PROBNP in the last 8760 hours. Coagulation Profile: No results for input(s): INR, PROTIME in the last 168 hours. Thyroid  Function Tests: No results for input(s): TSH, T4TOTAL, FREET4, T3FREE, THYROIDAB in the last 72 hours. Lipid Profile: No results for input(s): CHOL, HDL, LDLCALC, TRIG, CHOLHDL, LDLDIRECT in the last 72 hours. Anemia Panel: No results for input(s): VITAMINB12, FOLATE, FERRITIN, TIBC, IRON, RETICCTPCT in the last 72 hours. Urine analysis:    Component Value Date/Time   COLORURINE YELLOW 04/03/2024 1113   APPEARANCEUR HAZY (A) 04/03/2024 1113   LABSPEC 1.016 04/03/2024 1113   PHURINE 5.0 04/03/2024 1113   GLUCOSEU NEGATIVE 04/03/2024 1113   HGBUR SMALL (A) 04/03/2024 1113   BILIRUBINUR NEGATIVE 04/03/2024 1113   BILIRUBINUR negative 01/28/2018 1504   KETONESUR NEGATIVE 04/03/2024 1113   PROTEINUR NEGATIVE 04/03/2024 1113   UROBILINOGEN 0.2 01/28/2018 1504   NITRITE NEGATIVE 04/03/2024 1113   LEUKOCYTESUR NEGATIVE 04/03/2024 1113   Sepsis Labs: Invalid  input(s): PROCALCITONIN, LACTICIDVEN  Microbiology: Recent Results (from the past 240 hours)  C Difficile Quick Screen w PCR reflex     Status: Abnormal   Collection Time: 04/03/24 11:45 AM   Specimen: STOOL  Result Value Ref Range Status   C Diff antigen POSITIVE (A) NEGATIVE Final   C Diff toxin POSITIVE (A) NEGATIVE Final   C Diff interpretation Toxin producing C. difficile detected.  Final    Comment: Performed at [facility], [address], [place], [place] [address]  Gastrointestinal Panel by PCR , Stool     Status: None   Collection Time: 04/03/24  3:41 PM   Specimen: Stool  Result Value Ref Range Status   Campylobacter species NOT DETECTED NOT DETECTED Final   Plesimonas shigelloides  NOT DETECTED NOT DETECTED Final   Salmonella species NOT DETECTED NOT DETECTED Final   Yersinia enterocolitica NOT DETECTED NOT DETECTED Final   Vibrio species NOT DETECTED NOT DETECTED Final   Vibrio cholerae NOT DETECTED NOT DETECTED Final   Enteroaggregative E coli (EAEC) NOT DETECTED NOT DETECTED Final   Enteropathogenic E coli (EPEC) NOT DETECTED NOT DETECTED Final   Enterotoxigenic E coli (ETEC) NOT DETECTED NOT DETECTED Final   Shiga like toxin producing E coli (STEC) NOT DETECTED NOT DETECTED Final   Shigella/Enteroinvasive E coli (EIEC) NOT DETECTED NOT DETECTED Final   Cryptosporidium NOT DETECTED NOT DETECTED Final   Cyclospora cayetanensis NOT DETECTED NOT DETECTED Final   Entamoeba histolytica NOT DETECTED NOT DETECTED Final   Giardia lamblia NOT DETECTED NOT DETECTED Final   Adenovirus F40/41 NOT DETECTED NOT DETECTED Final   Astrovirus NOT DETECTED NOT DETECTED Final   Norovirus GI/GII NOT DETECTED NOT DETECTED Final   Rotavirus A NOT DETECTED NOT DETECTED Final   Sapovirus (I, II, IV, and V) NOT DETECTED NOT DETECTED Final    Comment: Performed at [facility], [address], [place], [place] [address]    Radiology Studies: No results  found.  Scheduled Meds:  fidaxomicin  200 mg Oral BID   heparin  5,000 Units Subcutaneous Q12H   sertraline   100 mg Oral Daily   sodium chloride  flush  3 mL Intravenous Q12H   Continuous Infusions:  famotidine  (PEPCID ) IV 20 mg (04/06/24 1019)     LOS: 3 days   35 minutes with more than 50% spent in reviewing records, counseling patient/family and coordinating care.  Trenton Frock, MD [facility] [email or web address] 04/06/2024, 3:08 PM

## 2024-04-06 NOTE — Plan of Care (Signed)

## 2024-04-06 NOTE — Plan of Care (Signed)

## 2024-04-07 ENCOUNTER — Other Ambulatory Visit (HOSPITAL_COMMUNITY): Payer: Self-pay

## 2024-04-07 DIAGNOSIS — R112 Nausea with vomiting, unspecified: Secondary | ICD-10-CM | POA: Diagnosis not present

## 2024-04-07 DIAGNOSIS — R197 Diarrhea, unspecified: Secondary | ICD-10-CM | POA: Diagnosis not present

## 2024-04-07 LAB — CBC
HCT: 29.1 % — ABNORMAL LOW (ref 36.0–46.0)
Hemoglobin: 9.8 g/dL — ABNORMAL LOW (ref 12.0–15.0)
MCH: 29.1 pg (ref 26.0–34.0)
MCHC: 33.7 g/dL (ref 30.0–36.0)
MCV: 86.4 fL (ref 80.0–100.0)
Platelets: 229 10*3/uL (ref 150–400)
RBC: 3.37 MIL/uL — ABNORMAL LOW (ref 3.87–5.11)
RDW: 13.5 % (ref 11.5–15.5)
WBC: 6 10*3/uL (ref 4.0–10.5)
nRBC: 0 % (ref 0.0–0.2)

## 2024-04-07 LAB — BASIC METABOLIC PANEL WITH GFR
Anion gap: 7 (ref 5–15)
BUN: <5 mg/dL — ABNORMAL LOW (ref 6–20)
CO2: 25 mmol/L (ref 22–32)
Calcium: 8.1 mg/dL — ABNORMAL LOW (ref 8.9–10.3)
Chloride: 107 mmol/L (ref 98–111)
Creatinine, Ser: 0.71 mg/dL (ref 0.44–1.00)
GFR, Estimated: >60 mL/min (ref >60)
Glucose, Bld: 106 mg/dL — ABNORMAL HIGH (ref 70–99)
Potassium: 3.5 mmol/L (ref 3.5–5.1)
Sodium: 139 mmol/L (ref 135–145)

## 2024-04-07 LAB — CALCIUM, IONIZED: Calcium, Ionized, Serum: 4.8 mg/dL (ref 4.5–5.6)

## 2024-04-07 MED ORDER — FAMOTIDINE 20 MG PO TABS
20.0000 mg | ORAL_TABLET | Freq: Every day | ORAL | Status: DC
  Filled 2024-04-07: qty 1

## 2024-04-07 MED ORDER — FAMOTIDINE 20 MG PO TABS
20.0000 mg | ORAL_TABLET | Freq: Every day | ORAL | 0 refills | Status: DC
  Filled 2024-04-07: qty 15, 15d supply, fill #0

## 2024-04-07 MED ORDER — ONDANSETRON 4 MG PO TBDP: 4.0000 mg | ORAL_TABLET | Freq: Three times a day (TID) | ORAL | Status: DC | PRN

## 2024-04-07 MED ORDER — FIDAXOMICIN 200 MG PO TABS
200.0000 mg | ORAL_TABLET | Freq: Two times a day (BID) | ORAL | 0 refills | Status: AC
  Filled 2024-04-07: qty 14, 7d supply, fill #0

## 2024-04-07 MED ORDER — HYDROCODONE-ACETAMINOPHEN 5-325 MG PO TABS: 1.0000 | ORAL_TABLET | Freq: Four times a day (QID) | ORAL | Status: DC | PRN

## 2024-04-07 MED ORDER — HYDROCODONE-ACETAMINOPHEN 5-325 MG PO TABS
1.0000 | ORAL_TABLET | Freq: Four times a day (QID) | ORAL | 0 refills | Status: AC | PRN
  Filled 2024-04-07: qty 12, 3d supply, fill #0

## 2024-04-07 MED ORDER — ONDANSETRON 4 MG PO TBDP
4.0000 mg | ORAL_TABLET | Freq: Three times a day (TID) | ORAL | 0 refills | Status: DC | PRN
Start: 2024-04-07 — End: 2024-04-18
  Filled 2024-04-07: qty 10, 4d supply, fill #0

## 2024-04-07 NOTE — Plan of Care (Signed)

## 2024-04-07 NOTE — Discharge Summary (Signed)
 Physician Discharge Summary   Patient: Kristen Lane MRN: [id number] DOB: [birth year]  Admit date:     04/03/2024  Discharge date: 04/07/24  Discharge Physician: Trenton Frock   PCP: Abram Abraham, NP-C   Recommendations at discharge:   Patient was discharged home on fidaxomicin for 7 more days, for a total of 10 days of therapy.  She has been having some heartburn which was relieved by Pepcid  and therefore she was discharged home on Pepcid  as well.  She was discharged home with several days worth of hydrocodone and Zofran to take as needed.  Her pain was fairly well-controlled in house and she only infrequently needed morphine.  She was on iron prior to coming into the hospital and we stopped this and recommended she continue to hold her iron on an outpatient basis until her infection is completely cleared. She was recommended to follow-up with her PCP in the next 2 weeks to ensure continued improvement.  Discharge Diagnoses: Principal Problem:   Nausea, vomiting, and diarrhea  Resolved Problems:   * No resolved hospital problems. *  Hospital Course: 38 y.o. female with a PMH significant for 38 y.o. female with past medical history  of  Allergy, history of anemia, anxiety, asthma, C. difficile recently treated with p.o. vancomycin  course comes with vomiting that started today.  Patient was not being recurrence of diarrhea body aches generalized weakness chills night sweats that has been going on for the past 4 days.  ED MD discussed case with ID provider who recommended admission for C. difficile recurrence.  Discussed with ID who recommended starting with fidaxomicin as per C. difficile treatment protocol.  Patient was started on fidaxomicin on hospital day 1.  She continued to show improvement throughout her hospitalization.  Patient had fever prior to starting fidaxomicin and her fever broke after starting on the antibiotic.  She also had slow but continued improvement in her  abdominal pain as well as diarrhea output.  Due to improvement she was able to discharge home on 04/07/2024 with prescriptions for fidaxomicin, short-term course of hydrocodone, Zofran as needed.  We also had numerous conversations about hygiene around the house and ensuring those around her stay safe.  She denied having any immunocompromised people living at home with her.   Assessment & Plan: C dif recurrence: - Vast improvement on fidaxomicin. - Every day she has looked little bit better.  Now with only minimal abdominal tenderness.  She has decreased rectal output as well to just 2 episodes of diarrhea today. -No nausea or vomiting for the past 48 hours.  No fevers. -Plan will be to discharge home on the Doxy mycin today.   Anxiety: Will continue patient on her Zoloft .   Hyponatremia: - Secondary to fluid losses/hypotonic hyponatremia. - Completely resolved   Hypokalemia: - Secondary to #1 above as well.   - Resolved at discharge   Non-anion gap metabolic acidosis: - Secondary to ongoing diarrhea, now resolved.    Hypomagnesemia: - Has been replaced and normalized today.   Pseudo hypocalcemia: - Will continue to trend but this is secondary to decreased albumin.  Normal when corrected for low albumin   Normocytic anemia: - Denies any current bleeding. - Notable drop from May of this year.   - Will follow/transfuse for Hgb < 7 - Recommended to hold outpatient iron due to ongoing C. difficile infection        Consultants: None -verbally discussed case with infectious disease Procedures performed: None Disposition: Home Diet  recommendation:  Discharge Diet Orders (From admission, onward)     Start     Ordered   04/07/24 0000  Diet - low sodium heart healthy        04/07/24 1428           Regular diet DISCHARGE MEDICATION: Allergies as of 04/07/2024   No Known Allergies      Medication List     STOP taking these medications    IRON PO       TAKE  these medications    ascorbic acid 500 MG tablet Commonly known as: VITAMIN C Take 500 mg by mouth daily.   cetirizine 10 MG tablet Commonly known as: ZYRTEC Take 10 mg by mouth daily.   famotidine  20 MG tablet Commonly known as: PEPCID  Take 1 tablet (20 mg total) by mouth daily. Start taking on: April 08, 2024   fidaxomicin 200 MG Tabs tablet Commonly known as: DIFICID Take 1 tablet (200 mg total) by mouth 2 (two) times daily for 7 days.   HYDROcodone-acetaminophen 5-325 MG tablet Commonly known as: NORCO/VICODIN Take 1 tablet by mouth every 6 (six) hours as needed for up to 3 days for severe pain (pain score 7-10).   ondansetron 4 MG disintegrating tablet Commonly known as: ZOFRAN-ODT Take 1 tablet (4 mg total) by mouth every 8 (eight) hours as needed for nausea or vomiting.   paragard intrauterine copper Iud IUD 1 Device by Intrauterine route.   PRENATAL VITAMIN PO Take 1 tablet by mouth daily at 12 noon.   QC TUMERIC COMPLEX PO Take 1 capsule by mouth daily at 12 noon.   sertraline  100 MG tablet Commonly known as: ZOLOFT  Take 100 mg by mouth daily.   VITAMIN K2-VITAMIN D3 PO Take 1 tablet by mouth daily at 12 noon.   ZINC PO Take 1 tablet by mouth daily.        Discharge Exam: Filed Weights   04/05/24 0500 04/06/24 0500 04/07/24 0606  Weight: 59 kg 59.2 kg 54.9 kg   Gen: 38 y.o. female in no apparent distress.  Nontoxic Pulm: Non-labored breathing.  Clear to auscultation bilaterally.  CV: RRR  GI: Abdomen soft, minimal bloating without notable distension, minimal tenderness to palpation.  Good bowel sounds throughout. Ext: Warm, no deformities, no pedal edema Skin: No rashes, lesions no ulcers Neuro: Alert and oriented. No focal neurological deficits. Psych: Calm  Judgement and insight appear normal. Mood & affect appropriate.    Condition at discharge: good  The results of significant diagnostics from this hospitalization (including imaging,  microbiology, ancillary and laboratory) are listed below for reference.   Imaging Studies: CT ABDOMEN PELVIS W CONTRAST Result Date: 04/03/2024 CLINICAL DATA:  Abdominal pain, acute, nonlocalized EXAM: CT ABDOMEN AND PELVIS WITH CONTRAST TECHNIQUE: Multidetector CT imaging of the abdomen and pelvis was performed using the standard protocol following bolus administration of intravenous contrast. RADIATION DOSE REDUCTION: This exam was performed according to the departmental dose-optimization program which includes automated exposure control, adjustment of the mA and/or kV according to patient size and/or use of iterative reconstruction technique. CONTRAST:  75mL OMNIPAQUE IOHEXOL 350 MG/ML SOLN COMPARISON:  None Available. FINDINGS: Lower chest: No acute abnormality. Hepatobiliary: No focal liver abnormality. No gallstones, gallbladder wall thickening, or pericholecystic fluid. No biliary dilatation. Pancreas: No focal lesion. Normal pancreatic contour. No surrounding inflammatory changes. No main pancreatic ductal dilatation. Spleen: Normal in size without focal abnormality. Adrenals/Urinary Tract: No adrenal nodule bilaterally. Bilateral kidneys enhance symmetrically. No hydronephrosis. No  hydroureter. The urinary bladder is unremarkable. Stomach/Bowel: Stomach is within normal limits. No evidence of bowel wall thickening or dilatation. Stool throughout the majority of the colon. No pericolonic fat stranding. Lack up haustra of the descending and sigmoid colon-may be due to chronic inflammatory changes such as in ulcer colitis. Appendix appears normal. Vascular/Lymphatic: No abdominal aorta or iliac aneurysm. No abdominal, pelvic, or inguinal lymphadenopathy. Reproductive: T-shaped intrauterine device in appropriate position. Uterus and bilateral adnexa are unremarkable. Corpus luteum cyst within the left ovary-no further follow-up indicated. Other: Small volume simple free fluid. No intraperitoneal free gas.  No organized fluid collection. Musculoskeletal: No abdominal wall hernia or abnormality. No suspicious lytic or blastic osseous lesions. No acute displaced fracture. IMPRESSION: 1. No acute intra-abdominal or intrapelvic abnormality. 2. T-shaped intrauterine device in appropriate position. Electronically Signed   By: Morgane  Naveau M.D.   On: 04/03/2024 14:08    Microbiology: Results for orders placed or performed during the hospital encounter of 04/03/24  C Difficile Quick Screen w PCR reflex     Status: Abnormal   Collection Time: 04/03/24 11:45 AM   Specimen: STOOL  Result Value Ref Range Status   C Diff antigen POSITIVE (A) NEGATIVE Final   C Diff toxin POSITIVE (A) NEGATIVE Final   C Diff interpretation Toxin producing C. difficile detected.  Final    Comment: Performed at [facility], [address] [place], [place] [address]  Gastrointestinal Panel by PCR , Stool     Status: None   Collection Time: 04/03/24  3:41 PM   Specimen: Stool  Result Value Ref Range Status   Campylobacter species NOT DETECTED NOT DETECTED Final   Plesimonas shigelloides NOT DETECTED NOT DETECTED Final   Salmonella species NOT DETECTED NOT DETECTED Final   Yersinia enterocolitica NOT DETECTED NOT DETECTED Final   Vibrio species NOT DETECTED NOT DETECTED Final   Vibrio cholerae NOT DETECTED NOT DETECTED Final   Enteroaggregative E coli (EAEC) NOT DETECTED NOT DETECTED Final   Enteropathogenic E coli (EPEC) NOT DETECTED NOT DETECTED Final   Enterotoxigenic E coli (ETEC) NOT DETECTED NOT DETECTED Final   Shiga like toxin producing E coli (STEC) NOT DETECTED NOT DETECTED Final   Shigella/Enteroinvasive E coli (EIEC) NOT DETECTED NOT DETECTED Final   Cryptosporidium NOT DETECTED NOT DETECTED Final   Cyclospora cayetanensis NOT DETECTED NOT DETECTED Final   Entamoeba histolytica NOT DETECTED NOT DETECTED Final   Giardia lamblia NOT DETECTED NOT DETECTED Final   Adenovirus F40/41 NOT DETECTED NOT  DETECTED Final   Astrovirus NOT DETECTED NOT DETECTED Final   Norovirus GI/GII NOT DETECTED NOT DETECTED Final   Rotavirus A NOT DETECTED NOT DETECTED Final   Sapovirus (I, II, IV, and V) NOT DETECTED NOT DETECTED Final    Comment: Performed at [facility], [address] [place], [place] [address]    Labs: CBC: Recent Labs  Lab 04/03/24 1751 04/04/24 0421 04/05/24 0448 04/06/24 0428 04/07/24 0340  WBC 3.1* 4.9 8.6 6.1 6.0  HGB 9.6* 9.6* 10.2* 9.7* 9.8*  HCT 28.8* 29.0* 30.2* 29.7* 29.1*  MCV 85.2 86.6 85.8 87.6 86.4  PLT 174 172 209 214 229   Basic Metabolic Panel: Recent Labs  Lab 04/03/24 1119 04/03/24 1551 04/04/24 0421 04/05/24 0448 04/06/24 0428 04/07/24 0340  NA 136  --  130* 132* 134* 139  K 3.7  --  3.4* 3.6 3.2* 3.5  CL 104  --  101 100 103 107  CO2 20*  --  21* 27 25 25   GLUCOSE 109*  --  118* 102* 84 106*  BUN 6  --  5* <5* <5* <5*  CREATININE 0.75  --  0.96 0.87 0.79 0.71  CALCIUM 9.1  --  7.5* 7.4* 7.4* 8.1*  MG  --  2.0 1.2* 2.4 2.0  --    Liver Function Tests: Recent Labs  Lab 04/03/24 1119 04/04/24 0421 04/06/24 0428  AST 16 17 14*  ALT 13 12 13   ALKPHOS 47 48 49  BILITOT 0.6 1.0 0.8  PROT 7.0 5.2* 4.9*  ALBUMIN 3.3* 2.4* 2.2*   CBG: Recent Labs  Lab 04/04/24 1721  GLUCAP 92    Discharge time spent: less than 30 minutes.  Signed: Trenton Frock, MD [facility] 04/07/2024

## 2024-04-08 ENCOUNTER — Telehealth: Payer: Self-pay

## 2024-04-08 NOTE — Transitions of Care (Post Inpatient/ED Visit) (Unsigned)
   04/08/2024  Name: Kristen Lane MRN: [id number] DOB: 10-12-1986  Today's TOC FU Call Status: Today's TOC FU Call Status:: Unsuccessful Call (1st Attempt) Unsuccessful Call (1st Attempt) Date: 04/08/24  Attempted to reach the patient regarding the most recent Inpatient/ED visit.  Follow Up Plan: Additional outreach attempts will be made to reach the patient to complete the Transitions of Care (Post Inpatient/ED visit) call.   Signature Darrall Ellison, LPN [facility] Nurse Health Advisor Direct Dial [phone]

## 2024-04-11 NOTE — Transitions of Care (Post Inpatient/ED Visit) (Unsigned)
   04/11/2024  Name: Kristen Lane MRN: [id number] DOB: 08-24-1986  Today's TOC FU Call Status: Today's TOC FU Call Status:: Unsuccessful Call (2nd Attempt) Unsuccessful Call (1st Attempt) Date: 04/08/24 Unsuccessful Call (2nd Attempt) Date: 04/11/24  Attempted to reach the patient regarding the most recent Inpatient/ED visit.  Follow Up Plan: Additional outreach attempts will be made to reach the patient to complete the Transitions of Care (Post Inpatient/ED visit) call.   Signature Julian Lemmings, LPN [facility] Nurse Health Advisor Direct Dial [phone]

## 2024-04-18 ENCOUNTER — Encounter: Payer: Self-pay | Admitting: Internal Medicine

## 2024-04-18 ENCOUNTER — Ambulatory Visit: Admitting: Internal Medicine

## 2024-04-18 VITALS — BP 118/62 | HR 57 | Temp 97.7°F | Ht 69.0 in | Wt 117.0 lb

## 2024-04-18 DIAGNOSIS — D509 Iron deficiency anemia, unspecified: Secondary | ICD-10-CM | POA: Diagnosis not present

## 2024-04-18 DIAGNOSIS — Z8619 Personal history of other infectious and parasitic diseases: Secondary | ICD-10-CM

## 2024-04-18 DIAGNOSIS — A0472 Enterocolitis due to Clostridium difficile, not specified as recurrent: Secondary | ICD-10-CM | POA: Insufficient documentation

## 2024-04-18 NOTE — Assessment & Plan Note (Signed)
 Clinically resolved, no further eval or tx needed for now, continue to monitor for any recurrent symptoms.

## 2024-04-18 NOTE — Patient Instructions (Addendum)
 Ok to up the iron by taking OTC iron sulfate 325 mg per day  Please continue all other medications as before, except no further antibx are needed  Please have the pharmacy call with any other refills you may need.  Please keep your appointments with your specialists as you may have planned  Please go to the LAB at the blood drawing area for the tests to be done in SIX WEEKS - at the [address] LAB in the basement ( no appointment needed) for follow up iron and  blood count testing  You will be contacted by phone if any changes need to be made immediately.  Otherwise, you will receive a letter about your results with an explanation, but please check with MyChart first.

## 2024-04-18 NOTE — Progress Notes (Signed)
 Patient ID: Kristen Lane, female   DOB: 1985-11-05, 38 y.o.   MRN: [id number]        Chief Complaint: follow up post hospn June 15 - 19 2025       HPI:  Kristen Lane is a 38 y.o. female here with recent hospn for recurrent c diff colitis - finished antibx fidaxomicin   yesterday, today aching, pain, fever, diarrhea,  blood.  N/V resolved.  Pt denies chest pain, increased sob or doe, wheezing, orthopnea, PND, increased LE swelling, palpitations, dizziness or syncope.   Pt denies polydipsia, polyuria, or new focal neuro s/s.   Has recent worsening iron deficiency anemia with recent colitis episode. Denies worsening reflux, abd pain, dysphagia       Wt Readings from Last 3 Encounters:  04/18/24 117 lb (53.1 kg)  04/07/24 121 lb 0.5 oz (54.9 kg)  03/15/24 121 lb (54.9 kg)   BP Readings from Last 3 Encounters:  04/18/24 118/62  04/07/24 (!) 104/57  03/15/24 (!) 98/54         Past Medical History:  Diagnosis Date   Allergy    Anemia    Anxiety    Asthma    Encounter for general adult medical examination with abnormal findings 11/11/2023   Past Surgical History:  Procedure Laterality Date   WISDOM TOOTH EXTRACTION      reports that she has never smoked. She has never used smokeless tobacco. She reports current alcohol use of about 3.0 standard drinks of alcohol per week. She reports that she does not use drugs. family history includes Breast cancer in her maternal grandmother and paternal grandmother; Cancer in her maternal grandmother and paternal grandmother; Emphysema in her maternal grandfather; Hyperparathyroidism in her sister; Prostate cancer in her father. No Known Allergies Current Outpatient Medications on File Prior to Visit  Medication Sig Dispense Refill   ascorbic acid (VITAMIN C) 500 MG tablet Take 500 mg by mouth daily.     cetirizine (ZYRTEC) 10 MG tablet Take 10 mg by mouth daily.     Multiple Vitamins-Minerals (ZINC PO) Take 1 tablet by mouth daily.     paragard  intrauterine copper IUD IUD 1 Device by Intrauterine route.     Prenatal Vit-Fe Fumarate-FA (PRENATAL VITAMIN PO) Take 1 tablet by mouth daily at 12 noon.     sertraline  (ZOLOFT ) 100 MG tablet Take 100 mg by mouth daily.     Turmeric (QC TUMERIC COMPLEX PO) Take 1 capsule by mouth daily at 12 noon.     Vitamin D-Vitamin K (VITAMIN K2-VITAMIN D3 PO) Take 1 tablet by mouth daily at 12 noon.     No current facility-administered medications on file prior to visit.        ROS:  All others reviewed and negative.  Objective        PE:  BP 118/62 (BP Location: Left Arm, Patient Position: Sitting, Cuff Size: Normal)   Pulse (!) 57   Temp 97.7 F (36.5 C) (Oral)   Ht 5' 9 (1.753 m)   Wt 117 lb (53.1 kg)   LMP 04/03/2024 (Exact Date)   SpO2 100%   BMI 17.28 kg/m                 Constitutional: Pt appears in NAD               HENT: Head: NCAT.                Right Ear: External ear normal.  Left Ear: External ear normal.                Eyes: . Pupils are equal, round, and reactive to light. Conjunctivae and EOM are normal               Nose: without d/c or deformity               Neck: Neck supple. Gross normal ROM               Cardiovascular: Normal rate and regular rhythm.                 Pulmonary/Chest: Effort normal and breath sounds without rales or wheezing.                Abd:  Soft, NT, ND, + BS, no organomegaly               Neurological: Pt is alert. At baseline orientation, motor grossly intact               Skin: Skin is warm. No rashes, no other new lesions, LE edema - none               Psychiatric: Pt behavior is normal without agitation   Micro: none  Cardiac tracings I have personally interpreted today:  none  Pertinent Radiological findings (summarize): none   Lab Results  Component Value Date   WBC 6.0 04/07/2024   HGB 9.8 (L) 04/07/2024   HCT 29.1 (L) 04/07/2024   PLT 229 04/07/2024   GLUCOSE 106 (H) 04/07/2024   CHOL 194 11/11/2023   TRIG  65.0 11/11/2023   HDL 74.90 11/11/2023   LDLCALC 106 (H) 11/11/2023   ALT 13 04/06/2024   AST 14 (L) 04/06/2024   NA 139 04/07/2024   K 3.5 04/07/2024   CL 107 04/07/2024   CREATININE 0.71 04/07/2024   BUN <5 (L) 04/07/2024   CO2 25 04/07/2024   TSH 1.65 01/07/2023   Assessment/Plan:  Kristen Lane is a 38 y.o. White or Caucasian [1] female with  has a past medical history of Allergy, Anemia, Anxiety, Asthma, and Encounter for general adult medical examination with abnormal findings (11/11/2023).  C. difficile colitis Clinically resolved, no further eval or tx needed for now, continue to monitor for any recurrent symptoms.  Iron deficiency anemia Lab Results  Component Value Date   WBC 6.0 04/07/2024   HGB 9.8 (L) 04/07/2024   HCT 29.1 (L) 04/07/2024   MCV 86.4 04/07/2024   PLT 229 04/07/2024   With recent mild worsening with acute illness, for change MVI with iron to PNVwith iron, and otc iron sulfate 325 mg every day, and f/u cbc with iron lab in 6 wks  Followup: Return if symptoms worsen or fail to improve.  Lynwood Rush, MD 04/18/2024 8:48 PM [facility]

## 2024-04-18 NOTE — Assessment & Plan Note (Addendum)
 Lab Results  Component Value Date   WBC 6.0 04/07/2024   HGB 9.8 (L) 04/07/2024   HCT 29.1 (L) 04/07/2024   MCV 86.4 04/07/2024   PLT 229 04/07/2024   With recent mild worsening with acute illness, for change MVI with iron to PNVwith iron, and otc iron sulfate 325 mg every day, and f/u cbc with iron lab in 6 wks

## 2024-04-18 NOTE — Addendum Note (Signed)
 Addended by: NORLEEN LYNWOOD ORN on: 04/18/2024 08:49 PM   Modules accepted: Level of Service

## 2024-05-10 ENCOUNTER — Ambulatory Visit: Payer: 59 | Admitting: Family Medicine

## 2024-05-16 ENCOUNTER — Ambulatory Visit: Payer: Self-pay | Admitting: Gastroenterology

## 2024-06-21 ENCOUNTER — Other Ambulatory Visit: Payer: Self-pay | Admitting: Family Medicine

## 2024-06-21 DIAGNOSIS — F419 Anxiety disorder, unspecified: Secondary | ICD-10-CM

## 2024-06-24 ENCOUNTER — Other Ambulatory Visit: Payer: Self-pay | Admitting: Family Medicine

## 2024-06-24 ENCOUNTER — Encounter: Payer: Self-pay | Admitting: Family Medicine

## 2024-06-24 MED ORDER — CLONAZEPAM 0.5 MG PO TABS: 0.5000 mg | ORAL_TABLET | Freq: Every day | ORAL | 0 refills | Status: DC | PRN

## 2024-07-14 ENCOUNTER — Encounter: Payer: Self-pay | Admitting: Family Medicine

## 2024-07-14 ENCOUNTER — Other Ambulatory Visit (HOSPITAL_COMMUNITY)
Admission: RE | Admit: 2024-07-14 | Discharge: 2024-07-14 | Disposition: A | Discharge status: Home / Self Care | Source: Ambulatory Visit | Attending: Family Medicine | Admitting: Family Medicine

## 2024-07-14 ENCOUNTER — Ambulatory Visit: Admitting: Family Medicine

## 2024-07-14 VITALS — BP 100/64 | HR 58 | Temp 97.6°F | Ht 69.0 in | Wt 122.0 lb

## 2024-07-14 DIAGNOSIS — R0789 Other chest pain: Secondary | ICD-10-CM

## 2024-07-14 DIAGNOSIS — R001 Bradycardia, unspecified: Secondary | ICD-10-CM

## 2024-07-14 DIAGNOSIS — D509 Iron deficiency anemia, unspecified: Secondary | ICD-10-CM

## 2024-07-14 DIAGNOSIS — N898 Other specified noninflammatory disorders of vagina: Secondary | ICD-10-CM

## 2024-07-14 DIAGNOSIS — Z23 Encounter for immunization: Secondary | ICD-10-CM

## 2024-07-14 DIAGNOSIS — F419 Anxiety disorder, unspecified: Secondary | ICD-10-CM | POA: Diagnosis not present

## 2024-07-14 LAB — IBC PANEL
Iron: 53 ug/dL (ref 42–145)
Saturation Ratios: 13.5 % — ABNORMAL LOW (ref 20.0–50.0)
TIBC: 393.4 ug/dL (ref 250.0–450.0)
Transferrin: 281 mg/dL (ref 212.0–360.0)

## 2024-07-14 LAB — CBC WITH DIFFERENTIAL/PLATELET
Basophils Absolute: 0 K/uL (ref 0.0–0.1)
Basophils Relative: 0.7 % (ref 0.0–3.0)
Eosinophils Absolute: 0.1 K/uL (ref 0.0–0.7)
Eosinophils Relative: 1.3 % (ref 0.0–5.0)
HCT: 39.3 % (ref 36.0–46.0)
Hemoglobin: 13.2 g/dL (ref 12.0–15.0)
Lymphocytes Relative: 32.9 % (ref 12.0–46.0)
Lymphs Abs: 1.8 K/uL (ref 0.7–4.0)
MCHC: 33.5 g/dL (ref 30.0–36.0)
MCV: 87.2 fl (ref 78.0–100.0)
Monocytes Absolute: 0.4 K/uL (ref 0.1–1.0)
Monocytes Relative: 7.4 % (ref 3.0–12.0)
Neutro Abs: 3.1 K/uL (ref 1.4–7.7)
Neutrophils Relative %: 57.7 % (ref 43.0–77.0)
Platelets: 230 K/uL (ref 150.0–400.0)
RBC: 4.51 Mil/uL (ref 3.87–5.11)
RDW: 13.3 % (ref 11.5–15.5)
WBC: 5.4 K/uL (ref 4.0–10.5)

## 2024-07-14 LAB — COMPREHENSIVE METABOLIC PANEL WITH GFR
ALT: 12 U/L (ref 0–35)
AST: 17 U/L (ref 0–37)
Albumin: 4.7 g/dL (ref 3.5–5.2)
Alkaline Phosphatase: 34 U/L — ABNORMAL LOW (ref 39–117)
BUN: 10 mg/dL (ref 6–23)
CO2: 30 meq/L (ref 19–32)
Calcium: 9.4 mg/dL (ref 8.4–10.5)
Chloride: 100 meq/L (ref 96–112)
Creatinine, Ser: 0.83 mg/dL (ref 0.40–1.20)
GFR: 89.96 mL/min (ref >60.00)
Glucose, Bld: 143 mg/dL — ABNORMAL HIGH (ref 70–99)
Potassium: 3.7 meq/L (ref 3.5–5.1)
Sodium: 138 meq/L (ref 135–145)
Total Bilirubin: 0.5 mg/dL (ref 0.2–1.2)
Total Protein: 7.3 g/dL (ref 6.0–8.3)

## 2024-07-14 LAB — TSH: TSH: 0.99 u[IU]/mL (ref 0.35–5.50)

## 2024-07-14 LAB — FOLATE: Folate: >22.9 ng/mL (ref >5.9)

## 2024-07-14 LAB — TROPONIN I (HIGH SENSITIVITY): High Sens Troponin I: <2 ng/L (ref 2–17)

## 2024-07-14 LAB — VITAMIN B12: Vitamin B-12: 289 pg/mL (ref 211–911)

## 2024-07-14 LAB — T4, FREE: Free T4: 0.9 ng/dL (ref 0.60–1.60)

## 2024-07-14 LAB — FERRITIN: Ferritin: 11.9 ng/mL (ref 10.0–291.0)

## 2024-07-14 MED ORDER — CLONAZEPAM 1 MG PO TABS: 1.0000 mg | ORAL_TABLET | Freq: Two times a day (BID) | ORAL | 0 refills | Status: AC | PRN

## 2024-07-14 MED ORDER — COVID-19 MRNA VACC (MODERNA) 50 MCG/0.5ML IM SUSP: 0.5000 mL | Freq: Once | INTRAMUSCULAR | 0 refills | Status: AC

## 2024-07-14 NOTE — Progress Notes (Unsigned)
 Subjective:     Patient ID: Kristen Lane, female    DOB: 1986/01/21, 38 y.o.   MRN: [id number]  Chief Complaint  Patient presents with   Follow-up    Wants to be tested for BV, noticed odor that always happens when she has it  Also wants covid order  Would like iron checked     HPI  Discussed the use of AI scribe software for clinical note transcription with the patient, who gave verbal consent to proceed.  History of Present Illness Kristen Lane is a 38 year old female who presents for follow-up of a recent Clostridioides difficile infection and iron deficiency anemia.  Clostridioides difficile infection - Hospitalized for Clostridioides difficile infection, initially treated with antibiotics - Symptoms recurred after three days, requiring rehospitalization - Treated with Dificid  during second hospitalization, which resolved symptoms  Iron deficiency anemia - Iron levels decreased during hospitalization for C. difficile infection - Typically manages low iron with a multivitamin, but discontinued use over the summer due to nausea - No recent B12 or folate levels checked since hospitalization - Baseline energy is low, but no symptomatic anemia  Vaginal odor - Experiences a specific odor consistent with bacterial vaginosis - No associated itching or discomfort - Currently menstruating - Concerned about taking oral antibiotics due to recent C. difficile infection, prefers topical treatment  Anxiety and sleep disturbance - Anxiety affects sleep and causes chest tightness - Previously used Klonopin  infrequently, 1 mg - Anxiety related to life transitions - Occasional heart palpitations lasting a single beat - No chest pain, shortness of breath, or leg pain     Health Maintenance Due  Topic Date Due   Pneumococcal Vaccine (1 of 2 - PCV) Never done   COVID-19 Vaccine (4 - 2025-26 season) 06/20/2024    Past Medical History:  Diagnosis Date   Allergy    Anemia     Anxiety    Asthma    Encounter for general adult medical examination with abnormal findings 11/11/2023    Past Surgical History:  Procedure Laterality Date   WISDOM TOOTH EXTRACTION      Family History  Problem Relation Age of Onset   Prostate cancer Father    Hyperparathyroidism Sister    Breast cancer Maternal Grandmother    Cancer Maternal Grandmother    Emphysema Maternal Grandfather    Breast cancer Paternal Grandmother    Cancer Paternal Grandmother     Social History   Socioeconomic History   Marital status: Single    Spouse name: Bernardino Dames   Number of children: 0   Years of education: Master's Degree   Highest education level: Master's degree (e.g., MA, MS, MEng, MEd, MSW, MBA)  Occupational History   Occupation: Orthoptist: [employer]  Tobacco Use   Smoking status: Never   Smokeless tobacco: Never  Vaping Use   Vaping status: Never Used  Substance and Sexual Activity   Alcohol use: Yes    Alcohol/week: 3.0 standard drinks of alcohol    Types: 1 Glasses of wine, 1 Cans of beer, 1 Shots of liquor per week    Comment: 3 in last month   Drug use: No   Sexual activity: Yes    Birth control/protection: I.U.D.  Other Topics Concern   Not on file  Social History Narrative   Lives with her husband (married 04/18/2017).   Parents live nearby.   Sister lives in [place] .   Social Drivers of Health  Financial Resource Strain: Low Risk  (01/31/2023)   Overall Financial Resource Strain (CARDIA)    Difficulty of Paying Living Expenses: Not hard at all  Food Insecurity: No Food Insecurity (04/03/2024)   Hunger Vital Sign    Worried About Running Out of Food in the Last Year: Never true    Ran Out of Food in the Last Year: Never true  Transportation Needs: No Transportation Needs (04/03/2024)   PRAPARE - Administrator, Civil Service (Medical): No    Lack of Transportation (Non-Medical): No  Physical  Activity: Insufficiently Active (01/31/2023)   Exercise Vital Sign    Days of Exercise per Week: 3 days    Minutes of Exercise per Session: 40 min  Stress: Stress Concern Present (01/31/2023)   [employer] of Occupational Health - Occupational Stress Questionnaire    Feeling of Stress : Rather much  Social Connections: Unknown (04/29/2023)   Received from [facility]   Social Network    Social Network: Not on file  Intimate Partner Violence: Not At Risk (04/03/2024)   Humiliation, Afraid, Rape, and Kick questionnaire    Fear of Current or Ex-Partner: No    Emotionally Abused: No    Physically Abused: No    Sexually Abused: No    Outpatient Medications Prior to Visit  Medication Sig Dispense Refill   ascorbic acid (VITAMIN C) 500 MG tablet Take 500 mg by mouth daily.     cetirizine (ZYRTEC) 10 MG tablet Take 10 mg by mouth daily.     Multiple Vitamins-Minerals (ZINC PO) Take 1 tablet by mouth daily.     paragard intrauterine copper IUD IUD 1 Device by Intrauterine route.     Prenatal Vit-Fe Fumarate-FA (PRENATAL VITAMIN PO) Take 1 tablet by mouth daily at 12 noon.     sertraline  (ZOLOFT ) 100 MG tablet Take 100 mg by mouth daily.     Turmeric (QC TUMERIC COMPLEX PO) Take 1 capsule by mouth daily at 12 noon.     Vitamin D-Vitamin K (VITAMIN K2-VITAMIN D3 PO) Take 1 tablet by mouth daily at 12 noon.     clonazePAM  (KLONOPIN ) 0.5 MG tablet Take 1 tablet (0.5 mg total) by mouth daily as needed for anxiety. 10 tablet 0   No facility-administered medications prior to visit.    No Known Allergies  ROS Per HPI    Objective:    Physical Exam Constitutional:      General: She is not in acute distress.    Appearance: She is not ill-appearing.  HENT:     Mouth/Throat:     Mouth: Mucous membranes are moist.     Pharynx: Oropharynx is clear.  Eyes:     Extraocular Movements: Extraocular movements intact.     Conjunctiva/sclera: Conjunctivae normal.     Pupils: Pupils are  equal, round, and reactive to light.  Cardiovascular:     Rate and Rhythm: Normal rate and regular rhythm.  Pulmonary:     Effort: Pulmonary effort is normal.     Breath sounds: Normal breath sounds.  Musculoskeletal:     Cervical back: Normal range of motion and neck supple. No tenderness.     Right lower leg: No edema.     Left lower leg: No edema.  Lymphadenopathy:     Cervical: No cervical adenopathy.  Skin:    General: Skin is warm and dry.  Neurological:     General: No focal deficit present.     Mental Status: She is alert  and oriented to person, place, and time.     Motor: No weakness.     Coordination: Coordination normal.     Gait: Gait normal.  Psychiatric:        Mood and Affect: Mood normal.        Behavior: Behavior normal.        Thought Content: Thought content normal.      BP 100/64   Pulse (!) 58   Temp 97.6 F (36.4 C) (Temporal)   Ht 5' 9 (1.753 m)   Wt 122 lb (55.3 kg)   SpO2 99%   BMI 18.02 kg/m  Wt Readings from Last 3 Encounters:  07/14/24 122 lb (55.3 kg)  04/18/24 117 lb (53.1 kg)  04/07/24 121 lb 0.5 oz (54.9 kg)       Assessment & Plan:   Problem List Items Addressed This Visit     Anxiety   Iron deficiency anemia - Primary   Relevant Orders   CBC with Differential/Platelet (Completed)   Comprehensive metabolic panel with GFR (Completed)   Ferritin (Completed)   Folate (Completed)   Vitamin B12 (Completed)   Other Visit Diagnoses       Need for influenza vaccination       Relevant Orders   Flu vaccine trivalent PF, 6mos and older(Flulaval,Afluria,Fluarix,Fluzone) (Completed)     Chest heaviness       Relevant Orders   EKG 12-Lead (Completed)   CBC with Differential/Platelet (Completed)   Comprehensive metabolic panel with GFR (Completed)   TSH (Completed)   T4, free (Completed)   Troponin I (High Sensitivity) (Completed)     Vaginal odor       Relevant Orders   Cervicovaginal ancillary only (Completed)      Bradycardia       Relevant Orders   TSH (Completed)   T4, free (Completed)       Assessment and Plan Assessment & Plan Iron deficiency anemia Chronic iron deficiency anemia, likely exacerbated by recent acute illness. Managed with multivitamin containing iron, but she reports nausea with current multivitamin. No recent blood work since hospitalization. - Order blood work to check iron levels, B12, and folate. - Consider starting iron supplement based on blood work results.  Bacterial vaginosis (suspected) Suspected bacterial vaginosis based on reported specific odor. No itching or discomfort reported. She is on her period, which may affect sample collection. Prefers to avoid systemic antibiotics due to history of C. diff. - Perform self-swab for bacterial vaginosis. - If positive, prescribe topical treatment to avoid systemic antibiotics.  Anxiety with episodic palpitations and chest heaviness (under evaluation) Recent onset of anxiety with chest heaviness and episodic palpitations. No shortness of breath or significant cardiac risk factors. Previous EKG showed sinus tachycardia. Symptoms appear temporary and circumstantial. - Order EKG and troponin to rule out cardiac issues. - Prescribe 1 mg Klonopin  for anxiety, advise against nightly use.  EKG shows sinus bradycardia, rate 50, no acute ST T wave changes   History of Clostridioides difficile infection History of C. diff infection requiring hospitalization and treatment with antibiotics. Resolved with current treatment, but she is cautious about antibiotic use due to risk of recurrence.     I have discontinued Cait Strathman's clonazePAM . I am also having her start on clonazePAM  and (COVID-19 mRNA vaccine (Moderna, >/= 72yrs)). Additionally, I am having her maintain her Prenatal Vit-Fe Fumarate-FA (PRENATAL VITAMIN PO), paragard intrauterine copper, sertraline , cetirizine, Multiple Vitamins-Minerals (ZINC PO), Vitamin D-Vitamin K  (VITAMIN K2-VITAMIN D3 PO), Turmeric (  QC TUMERIC COMPLEX PO), and ascorbic acid.  Meds ordered this encounter  Medications   clonazePAM  (KLONOPIN ) 1 MG tablet    Sig: Take 1 tablet (1 mg total) by mouth 2 (two) times daily as needed for anxiety.    Dispense:  20 tablet    Refill:  0    Supervising Provider:   ROLLENE NORRIS A [[id number]]   COVID-19 mRNA vaccine, Moderna, >/= 68yrs, (SPIKEVAX) injection    Sig: Inject 0.5 mLs into the muscle once for 1 dose.    Dispense:  0.5 mL    Refill:  0    Can substitute brand per patient preference/availability

## 2024-07-15 ENCOUNTER — Ambulatory Visit: Payer: Self-pay | Admitting: Family Medicine

## 2024-07-15 DIAGNOSIS — R7309 Other abnormal glucose: Secondary | ICD-10-CM

## 2024-07-15 LAB — CERVICOVAGINAL ANCILLARY ONLY
Bacterial Vaginitis (gardnerella): NEGATIVE
Candida Glabrata: NEGATIVE
Candida Vaginitis: NEGATIVE
Chlamydia: NEGATIVE
Comment: NEGATIVE
Comment: NEGATIVE
Comment: NEGATIVE
Comment: NEGATIVE
Comment: NEGATIVE
Comment: NORMAL
Neisseria Gonorrhea: NEGATIVE
Trichomonas: NEGATIVE

## 2024-07-18 NOTE — Telephone Encounter (Signed)
 Do you want to add anything else?

## 2024-07-19 NOTE — Addendum Note (Signed)
 Addended by: Skylarr Liz E on: 07/19/2024 10:36 AM   Modules accepted: Orders

## 2024-07-25 ENCOUNTER — Other Ambulatory Visit (INDEPENDENT_AMBULATORY_CARE_PROVIDER_SITE_OTHER)

## 2024-07-25 ENCOUNTER — Ambulatory Visit: Payer: Self-pay | Admitting: Family Medicine

## 2024-07-25 DIAGNOSIS — R7309 Other abnormal glucose: Secondary | ICD-10-CM

## 2024-07-25 LAB — HEMOGLOBIN A1C: Hgb A1c MFr Bld: 5.7 % (ref 4.6–6.5)

## 2024-08-12 ENCOUNTER — Other Ambulatory Visit: Payer: Self-pay | Admitting: Family Medicine

## 2024-08-12 DIAGNOSIS — F429 Obsessive-compulsive disorder, unspecified: Secondary | ICD-10-CM

## 2024-08-12 DIAGNOSIS — F419 Anxiety disorder, unspecified: Secondary | ICD-10-CM

## 2024-11-10 ENCOUNTER — Other Ambulatory Visit: Payer: Self-pay | Admitting: Family Medicine

## 2024-11-10 DIAGNOSIS — F429 Obsessive-compulsive disorder, unspecified: Secondary | ICD-10-CM

## 2024-11-10 DIAGNOSIS — F419 Anxiety disorder, unspecified: Secondary | ICD-10-CM

## 2024-11-18 ENCOUNTER — Encounter: Payer: Self-pay | Admitting: Family Medicine

## 2024-11-18 ENCOUNTER — Ambulatory Visit: Admitting: Family Medicine

## 2024-11-18 VITALS — BP 124/70 | HR 75 | Temp 97.6°F | Ht 69.0 in | Wt 128.0 lb

## 2024-11-18 DIAGNOSIS — R5383 Other fatigue: Secondary | ICD-10-CM

## 2024-11-18 DIAGNOSIS — Z13220 Encounter for screening for lipoid disorders: Secondary | ICD-10-CM | POA: Diagnosis not present

## 2024-11-18 DIAGNOSIS — R7309 Other abnormal glucose: Secondary | ICD-10-CM | POA: Diagnosis not present

## 2024-11-18 DIAGNOSIS — J3089 Other allergic rhinitis: Secondary | ICD-10-CM | POA: Diagnosis not present

## 2024-11-18 DIAGNOSIS — F419 Anxiety disorder, unspecified: Secondary | ICD-10-CM | POA: Diagnosis not present

## 2024-11-18 DIAGNOSIS — Z862 Personal history of diseases of the blood and blood-forming organs and certain disorders involving the immune mechanism: Secondary | ICD-10-CM

## 2024-11-18 DIAGNOSIS — Z0001 Encounter for general adult medical examination with abnormal findings: Secondary | ICD-10-CM

## 2024-11-18 DIAGNOSIS — Z113 Encounter for screening for infections with a predominantly sexual mode of transmission: Secondary | ICD-10-CM

## 2024-11-18 DIAGNOSIS — Z0000 Encounter for general adult medical examination without abnormal findings: Secondary | ICD-10-CM | POA: Diagnosis not present

## 2024-11-18 LAB — LIPID PANEL
Cholesterol: 195 mg/dL (ref 28–200)
HDL: 66.1 mg/dL
LDL Cholesterol: 117 mg/dL — ABNORMAL HIGH (ref 10–99)
NonHDL: 129.09
Total CHOL/HDL Ratio: 3
Triglycerides: 62 mg/dL (ref 10.0–149.0)
VLDL: 12.4 mg/dL (ref 0.0–40.0)

## 2024-11-18 LAB — COMPREHENSIVE METABOLIC PANEL WITH GFR
ALT: 12 U/L (ref 3–35)
AST: 19 U/L (ref 5–37)
Albumin: 4.4 g/dL (ref 3.5–5.2)
Alkaline Phosphatase: 35 U/L — ABNORMAL LOW (ref 39–117)
BUN: 15 mg/dL (ref 6–23)
CO2: 29 meq/L (ref 19–32)
Calcium: 9.4 mg/dL (ref 8.4–10.5)
Chloride: 102 meq/L (ref 96–112)
Creatinine, Ser: 0.87 mg/dL (ref 0.40–1.20)
GFR: 84.81 mL/min
Glucose, Bld: 87 mg/dL (ref 70–99)
Potassium: 4.1 meq/L (ref 3.5–5.1)
Sodium: 139 meq/L (ref 135–145)
Total Bilirubin: 0.5 mg/dL (ref 0.2–1.2)
Total Protein: 7.1 g/dL (ref 6.0–8.3)

## 2024-11-18 LAB — CBC WITH DIFFERENTIAL/PLATELET
Basophils Absolute: 0.1 10*3/uL (ref 0.0–0.1)
Basophils Relative: 1.3 % (ref 0.0–3.0)
Eosinophils Absolute: 0.1 10*3/uL (ref 0.0–0.7)
Eosinophils Relative: 3.1 % (ref 0.0–5.0)
HCT: 38.3 % (ref 36.0–46.0)
Hemoglobin: 13.2 g/dL (ref 12.0–15.0)
Lymphocytes Relative: 40 % (ref 12.0–46.0)
Lymphs Abs: 1.8 10*3/uL (ref 0.7–4.0)
MCHC: 34.4 g/dL (ref 30.0–36.0)
MCV: 88.7 fl (ref 78.0–100.0)
Monocytes Absolute: 0.3 10*3/uL (ref 0.1–1.0)
Monocytes Relative: 7.3 % (ref 3.0–12.0)
Neutro Abs: 2.1 10*3/uL (ref 1.4–7.7)
Neutrophils Relative %: 48.3 % (ref 43.0–77.0)
Platelets: 269 10*3/uL (ref 150.0–400.0)
RBC: 4.32 Mil/uL (ref 3.87–5.11)
RDW: 12.9 % (ref 11.5–15.5)
WBC: 4.4 10*3/uL (ref 4.0–10.5)

## 2024-11-18 LAB — T4, FREE: Free T4: 0.71 ng/dL (ref 0.60–1.60)

## 2024-11-18 LAB — HEMOGLOBIN A1C: Hgb A1c MFr Bld: 5.7 % (ref 4.6–6.5)

## 2024-11-18 LAB — VITAMIN B12: Vitamin B-12: 449 pg/mL (ref 211–911)

## 2024-11-18 LAB — TSH: TSH: 2.18 u[IU]/mL (ref 0.35–5.50)

## 2024-11-18 LAB — FERRITIN: Ferritin: 17.1 ng/mL (ref 10.0–291.0)

## 2024-11-18 LAB — FOLATE: Folate: 22.6 ng/mL

## 2024-11-18 NOTE — Progress Notes (Signed)
 "  Complete physical exam  Patient: Kristen Lane   DOB: [birth year]   38 y.o. Female  MRN: [id number]  Subjective:    Chief Complaint  Patient presents with   Annual Exam    fasting   She is here for a complete physical exam.  Discussed the use of AI scribe software for clinical note transcription with the patient, who gave verbal consent to proceed.  History of Present Illness Kristen Lane is a 39 year old female who presents for her annual physical exam and preventive healthcare visit.  Asthma and allergic disease - Allergy-induced asthma stable since childhood - No recent exacerbations or changes in symptoms  Iron deficiency anemia and nutritional status - Iron deficiency anemia with low normal B12 - Developed fatigue, nausea, and lightheadedness in December - Discontinuation of iron supplement did not improve nausea - Restarting multivitamin with iron improved symptoms  Gastrointestinal infection and glycemic status - Hospitalized for Clostridioides difficile infection in October - Hemoglobin A1c was 5.7 during hospitalization while on antibiotics and not on steroids  Mental health and psychotropic medication - Mood stable on sertraline  for anxiety - Sees therapist regularly  Sexually transmitted infection screening - Requests STI screening - No genitourinary symptoms or known exposure  Dermatologic cancer risk assessment - Interested in dermatology screening due to maternal history of skin cancer (type unknown)    Health Maintenance  Topic Date Due   Pneumococcal Vaccine (1 of 2 - PCV) Never done   COVID-19 Vaccine (4 - 2025-26 season) 06/20/2024   DTaP/Tdap/Td vaccine (8 - Td or Tdap) 02/13/2025   Pap with HPV screening  12/27/2028   Flu Shot  Completed   Hepatitis B Vaccine  Completed   HPV Vaccine  Completed   Hepatitis C Screening  Completed   HIV Screening  Completed   Meningitis B Vaccine  Aged Out     Depression screening:    07/14/2024     3:50 PM 04/18/2024    8:45 AM 03/07/2024    8:29 AM  Depression screen PHQ 2/9  Decreased Interest 2 0 0  Down, Depressed, Hopeless 1 0 0  PHQ - 2 Score 3 0 0  Altered sleeping 2    Tired, decreased energy 0    Change in appetite 1    Feeling bad or failure about yourself  0    Trouble concentrating 2    Moving slowly or fidgety/restless 0    Suicidal thoughts 0    PHQ-9 Score 8     Difficult doing work/chores Somewhat difficult       Data saved with a previous flowsheet row definition   Anxiety Screening:    03/07/2024    8:30 AM 01/07/2023   10:38 AM  GAD 7 : Generalized Anxiety Score  Nervous, Anxious, on Edge 1  2   Control/stop worrying 1  3   Worry too much - different things  3   Trouble relaxing  2   Restless  0   Easily annoyed or irritable  2   Afraid - awful might happen  2   Total GAD 7 Score  14  Anxiety Difficulty  Not difficult at all     Data saved with a previous flowsheet row definition    Patient Care Team: Lendia Boby CROME, NP-C as PCP - General (Family Medicine)   Show/hide medication list[1]  Review of Systems  Constitutional:  Positive for malaise/fatigue. Negative for chills, fever and weight loss.  HENT:  Negative  for congestion, ear pain, sinus pain and sore throat.   Eyes:  Negative for blurred vision, double vision and pain.  Respiratory:  Negative for cough, shortness of breath and wheezing.   Cardiovascular:  Negative for chest pain, palpitations and leg swelling.  Gastrointestinal:  Negative for abdominal pain, constipation, diarrhea, nausea and vomiting.  Genitourinary:  Negative for dysuria, frequency and urgency.  Musculoskeletal:  Negative for back pain, joint pain and myalgias.  Skin:  Negative for rash.  Neurological:  Negative for dizziness, tingling, focal weakness and headaches.  Endo/Heme/Allergies:  Does not bruise/bleed easily.  Psychiatric/Behavioral:  Negative for depression, memory loss and suicidal ideas. The patient is  not nervous/anxious.        Objective:    BP 124/70   Pulse 75   Temp 97.6 F (36.4 C) (Temporal)   Ht 5' 9 (1.753 m)   Wt 128 lb (58.1 kg)   SpO2 98%   BMI 18.90 kg/m  BP Readings from Last 3 Encounters:  11/18/24 124/70  07/14/24 100/64  04/18/24 118/62   Wt Readings from Last 3 Encounters:  11/18/24 128 lb (58.1 kg)  07/14/24 122 lb (55.3 kg)  04/18/24 117 lb (53.1 kg)    Physical Exam Constitutional:      General: She is not in acute distress.    Appearance: She is not ill-appearing.  HENT:     Right Ear: Tympanic membrane, ear canal and external ear normal.     Left Ear: Tympanic membrane, ear canal and external ear normal.     Nose: Nose normal.     Mouth/Throat:     Mouth: Mucous membranes are moist.     Pharynx: Oropharynx is clear.  Eyes:     Extraocular Movements: Extraocular movements intact.     Conjunctiva/sclera: Conjunctivae normal.     Pupils: Pupils are equal, round, and reactive to light.  Neck:     Thyroid : No thyroid  mass, thyromegaly or thyroid  tenderness.  Cardiovascular:     Rate and Rhythm: Normal rate and regular rhythm.     Pulses: Normal pulses.     Heart sounds: Normal heart sounds.  Pulmonary:     Effort: Pulmonary effort is normal.     Breath sounds: Normal breath sounds.  Abdominal:     General: Bowel sounds are normal.     Palpations: Abdomen is soft.     Tenderness: There is no abdominal tenderness. There is no right CVA tenderness, left CVA tenderness, guarding or rebound.  Musculoskeletal:        General: Normal range of motion.     Cervical back: Normal range of motion and neck supple. No tenderness.     Right lower leg: No edema.     Left lower leg: No edema.  Lymphadenopathy:     Cervical: No cervical adenopathy.  Skin:    General: Skin is warm and dry.     Findings: No lesion or rash.  Neurological:     General: No focal deficit present.     Mental Status: She is alert and oriented to person, place, and time.      Cranial Nerves: No cranial nerve deficit.     Sensory: No sensory deficit.     Motor: No weakness.     Gait: Gait normal.  Psychiatric:        Mood and Affect: Mood normal.        Behavior: Behavior normal.        Thought Content: Thought content  normal.      No results found for any visits on 11/18/24.    Assessment & Plan:    Routine Health Maintenance and Physical Exam Problem List Items Addressed This Visit     Anxiety   Encounter for general adult medical examination with abnormal findings - Primary   Environmental and seasonal allergies   Other Visit Diagnoses       Elevated hemoglobin A1c       Relevant Orders   CBC with Differential/Platelet   Comprehensive metabolic panel with GFR   Hemoglobin A1c   TSH   T4, free     Screening examination for STI       Relevant Orders   RPR W/RFLX TO RPR TITER, TREPONEMAL AB, SCREEN AND DIAGNOSIS   HIV Antibody (routine testing w rflx)   GC/Chlamydia Probe Amp   Hepatitis C antibody   Hepatitis B surface antigen     Fatigue, unspecified type       Relevant Orders   CBC with Differential/Platelet   Comprehensive metabolic panel with GFR   Ferritin   Folate     History of iron deficiency anemia       Relevant Orders   CBC with Differential/Platelet   Comprehensive metabolic panel with GFR   Ferritin   Folate   Vitamin B12   TSH   T4, free     Screening for lipid disorders       Relevant Orders   Lipid panel       Assessment and Plan Assessment & Plan Iron deficiency anemia Recent fatigue and nausea. Symptoms improved with multivitamin containing iron. No current gastrointestinal side effects from iron supplementation. - Ordered complete blood count to assess current iron levels  Prediabetes Previous A1c of 5.7%. Recent hospitalization and antibiotic use may have influenced previous A1c levels. - Ordered A1c test to reassess glucose control  Anxiety disorder Well-managed on sertraline . Occasional anxiety  but no concerning symptoms reported. - Continue sertraline  100 mg oral daily  Fatigue Recent fatigue possibly related to iron deficiency anemia. Symptoms improved with resumption of iron supplementation. - Ordered complete blood count to assess current iron, folate, B12 levels as well as CBC, CMP, TSH and free T4  General Health Maintenance Up to date with gynecological appointments. No current need for mammograms as she is under 40. Discussed potential pneumonia vaccine, but she opted to defer. No current need for colonoscopy. Discussed dermatology referral due to family history of skin cancer. Preventive health care reviewed.  Counseling on healthy lifestyle including diet and exercise.  Recommend regular dental exams.   - Ordered STI screening as part of routine preventive care - Encouraged scheduling a dermatology appointment for skin check - Advised on skin protection measures to prevent sun damage     Return in about 1 year (around 11/18/2025).     Boby Mackintosh, NP-C      [1]  Outpatient Medications Prior to Visit  Medication Sig   ascorbic acid (VITAMIN C) 500 MG tablet Take 500 mg by mouth daily.   cetirizine (ZYRTEC) 10 MG tablet Take 10 mg by mouth daily.   clonazePAM  (KLONOPIN ) 1 MG tablet Take 1 tablet (1 mg total) by mouth 2 (two) times daily as needed for anxiety.   Multiple Vitamins-Minerals (ZINC PO) Take 1 tablet by mouth daily.   paragard intrauterine copper IUD IUD 1 Device by Intrauterine route.   Prenatal Vit-Fe Fumarate-FA (PRENATAL VITAMIN PO) Take 1 tablet by mouth daily at 12 noon.  sertraline  (ZOLOFT ) 100 MG tablet Take 1 &1/2 tablets (150 mg total) by mouth daily.   Turmeric (QC TUMERIC COMPLEX PO) Take 1 capsule by mouth daily at 12 noon.   Vitamin D-Vitamin K (VITAMIN K2-VITAMIN D3 PO) Take 1 tablet by mouth daily at 12 noon.   No facility-administered medications prior to visit.   "

## 2024-11-18 NOTE — Patient Instructions (Signed)

## 2024-11-19 LAB — SYPHILIS: RPR W/REFLEX TO RPR TITER AND TREPONEMAL ANTIBODIES, TRADITIONAL SCREENING AND DIAGNOSIS ALGORITHM: RPR Ser Ql: NONREACTIVE

## 2024-11-19 LAB — HIV ANTIBODY (ROUTINE TESTING W REFLEX)
HIV 1&2 Ab, 4th Generation: NONREACTIVE
HIV FINAL INTERPRETATION: NEGATIVE

## 2024-11-19 LAB — HEPATITIS C ANTIBODY: Hepatitis C Ab: NONREACTIVE

## 2024-11-19 LAB — HEPATITIS B SURFACE ANTIGEN: Hepatitis B Surface Ag: NONREACTIVE

## 2024-11-21 LAB — GC/CHLAMYDIA PROBE AMP
Chlamydia trachomatis, NAA: NEGATIVE
Neisseria Gonorrhoeae by PCR: NEGATIVE

## 2024-11-22 ENCOUNTER — Ambulatory Visit: Payer: Self-pay | Admitting: Family Medicine

## 2024-12-28 ENCOUNTER — Ambulatory Visit: Admitting: Obstetrics and Gynecology
